# Patient Record
Sex: Female | Born: 1950 | Race: Black or African American | Hispanic: No | Smoking: Never smoker
Health system: Southern US, Community
[De-identification: ages and names within clinical notes are randomized; demographics above are authoritative.]

## PROBLEM LIST (undated history)

## (undated) DIAGNOSIS — M545 Low back pain, unspecified: Secondary | ICD-10-CM

## (undated) DIAGNOSIS — E785 Hyperlipidemia, unspecified: Secondary | ICD-10-CM

## (undated) DIAGNOSIS — F329 Major depressive disorder, single episode, unspecified: Secondary | ICD-10-CM

## (undated) DIAGNOSIS — IMO0001 Reserved for inherently not codable concepts without codable children: Secondary | ICD-10-CM

## (undated) DIAGNOSIS — J329 Chronic sinusitis, unspecified: Secondary | ICD-10-CM

## (undated) DIAGNOSIS — F32A Depression, unspecified: Secondary | ICD-10-CM

## (undated) DIAGNOSIS — Z8719 Personal history of other diseases of the digestive system: Secondary | ICD-10-CM

## (undated) DIAGNOSIS — R000 Tachycardia, unspecified: Secondary | ICD-10-CM

## (undated) DIAGNOSIS — I700 Atherosclerosis of aorta: Secondary | ICD-10-CM

## (undated) DIAGNOSIS — I10 Essential (primary) hypertension: Secondary | ICD-10-CM

## (undated) HISTORY — DX: Reserved for inherently not codable concepts without codable children: IMO0001

## (undated) HISTORY — DX: Low back pain, unspecified: M54.50

## (undated) HISTORY — PX: BACK SURGERY: SHX140

## (undated) HISTORY — DX: Hyperlipidemia, unspecified: E78.5

## (undated) HISTORY — DX: Atherosclerosis of aorta: I70.0

## (undated) HISTORY — DX: Major depressive disorder, single episode, unspecified: F32.9

## (undated) HISTORY — PX: TUBAL LIGATION: SHX77

## (undated) HISTORY — DX: Personal history of other diseases of the digestive system: Z87.19

## (undated) HISTORY — PX: ABDOMINAL HYSTERECTOMY: SHX81

## (undated) HISTORY — DX: Depression, unspecified: F32.A

## (undated) HISTORY — DX: Essential (primary) hypertension: I10

## (undated) HISTORY — DX: Low back pain: M54.5

## (undated) HISTORY — DX: Tachycardia, unspecified: R00.0

---

## 1999-11-14 ENCOUNTER — Ambulatory Visit (HOSPITAL_COMMUNITY): Admission: RE | Admit: 1999-11-14 | Discharge: 1999-11-14 | Payer: Self-pay | Admitting: Specialist

## 1999-11-14 ENCOUNTER — Encounter: Payer: Self-pay | Admitting: Specialist

## 1999-11-28 ENCOUNTER — Encounter: Payer: Self-pay | Admitting: Specialist

## 1999-11-28 ENCOUNTER — Ambulatory Visit (HOSPITAL_COMMUNITY): Admission: RE | Admit: 1999-11-28 | Discharge: 1999-11-28 | Payer: Self-pay | Admitting: Specialist

## 1999-12-12 ENCOUNTER — Encounter: Payer: Self-pay | Admitting: Specialist

## 1999-12-12 ENCOUNTER — Ambulatory Visit (HOSPITAL_COMMUNITY): Admission: RE | Admit: 1999-12-12 | Discharge: 1999-12-12 | Payer: Self-pay | Admitting: Specialist

## 2000-01-11 ENCOUNTER — Inpatient Hospital Stay (HOSPITAL_COMMUNITY): Admission: RE | Admit: 2000-01-11 | Discharge: 2000-01-13 | Payer: Self-pay | Admitting: Specialist

## 2000-01-11 ENCOUNTER — Encounter (INDEPENDENT_AMBULATORY_CARE_PROVIDER_SITE_OTHER): Payer: Self-pay | Admitting: Specialist

## 2000-01-11 ENCOUNTER — Encounter: Payer: Self-pay | Admitting: Specialist

## 2000-01-22 ENCOUNTER — Inpatient Hospital Stay (HOSPITAL_COMMUNITY): Admission: EM | Admit: 2000-01-22 | Discharge: 2000-01-25 | Payer: Self-pay | Admitting: Emergency Medicine

## 2000-01-23 ENCOUNTER — Encounter: Payer: Self-pay | Admitting: Orthopedic Surgery

## 2001-07-11 ENCOUNTER — Emergency Department (HOSPITAL_COMMUNITY): Admission: EM | Admit: 2001-07-11 | Discharge: 2001-07-11 | Payer: Self-pay | Admitting: Emergency Medicine

## 2001-12-10 ENCOUNTER — Encounter (HOSPITAL_COMMUNITY): Admission: RE | Admit: 2001-12-10 | Discharge: 2002-03-10 | Payer: Self-pay | Admitting: Dentistry

## 2001-12-10 ENCOUNTER — Encounter (HOSPITAL_COMMUNITY): Payer: Self-pay | Admitting: Dentistry

## 2002-01-01 ENCOUNTER — Other Ambulatory Visit: Admission: RE | Admit: 2002-01-01 | Discharge: 2002-01-01 | Payer: Self-pay | Admitting: Internal Medicine

## 2002-12-17 ENCOUNTER — Encounter: Payer: Self-pay | Admitting: Internal Medicine

## 2002-12-17 ENCOUNTER — Ambulatory Visit (HOSPITAL_COMMUNITY): Admission: RE | Admit: 2002-12-17 | Discharge: 2002-12-17 | Payer: Self-pay | Admitting: Internal Medicine

## 2004-06-13 ENCOUNTER — Emergency Department (HOSPITAL_COMMUNITY): Admission: EM | Admit: 2004-06-13 | Discharge: 2004-06-13 | Payer: Self-pay | Admitting: Emergency Medicine

## 2006-04-13 ENCOUNTER — Emergency Department (HOSPITAL_COMMUNITY): Admission: EM | Admit: 2006-04-13 | Discharge: 2006-04-13 | Payer: Self-pay | Admitting: Emergency Medicine

## 2008-01-14 ENCOUNTER — Encounter: Payer: Self-pay | Admitting: Pulmonary Disease

## 2008-01-22 ENCOUNTER — Ambulatory Visit: Payer: Self-pay | Admitting: Cardiology

## 2008-01-22 ENCOUNTER — Encounter (INDEPENDENT_AMBULATORY_CARE_PROVIDER_SITE_OTHER): Payer: Self-pay | Admitting: Internal Medicine

## 2008-01-22 ENCOUNTER — Inpatient Hospital Stay (HOSPITAL_COMMUNITY): Admission: RE | Admit: 2008-01-22 | Discharge: 2008-01-24 | Payer: Self-pay | Admitting: Internal Medicine

## 2008-01-22 ENCOUNTER — Ambulatory Visit: Payer: Self-pay | Admitting: Vascular Surgery

## 2008-01-22 ENCOUNTER — Encounter: Payer: Self-pay | Admitting: Emergency Medicine

## 2008-01-24 ENCOUNTER — Encounter (INDEPENDENT_AMBULATORY_CARE_PROVIDER_SITE_OTHER): Payer: Self-pay | Admitting: *Deleted

## 2008-08-01 ENCOUNTER — Emergency Department (HOSPITAL_COMMUNITY): Admission: EM | Admit: 2008-08-01 | Discharge: 2008-08-01 | Payer: Self-pay | Admitting: Emergency Medicine

## 2009-04-08 DIAGNOSIS — IMO0001 Reserved for inherently not codable concepts without codable children: Secondary | ICD-10-CM

## 2009-04-08 HISTORY — DX: Reserved for inherently not codable concepts without codable children: IMO0001

## 2009-04-21 ENCOUNTER — Ambulatory Visit: Payer: Self-pay | Admitting: Vascular Surgery

## 2010-01-05 ENCOUNTER — Inpatient Hospital Stay (HOSPITAL_COMMUNITY): Admission: EM | Admit: 2010-01-05 | Discharge: 2010-01-07 | Payer: Self-pay | Admitting: Emergency Medicine

## 2010-02-16 LAB — LIPID PANEL
Cholesterol: 263 mg/dL — AB (ref 0–200)
HDL: 57 mg/dL (ref 35–70)
LDL Cholesterol: 134 mg/dL
Triglycerides: 360 mg/dL — AB (ref 40–160)

## 2010-05-26 ENCOUNTER — Ambulatory Visit: Payer: Self-pay | Admitting: Cardiology

## 2010-05-26 LAB — BASIC METABOLIC PANEL: BUN: 22 mg/dL — AB (ref 4–21)

## 2010-08-15 ENCOUNTER — Emergency Department (HOSPITAL_COMMUNITY): Admission: EM | Admit: 2010-08-15 | Discharge: 2010-08-16 | Payer: Self-pay | Admitting: Emergency Medicine

## 2010-08-31 ENCOUNTER — Ambulatory Visit: Payer: Self-pay | Admitting: Cardiology

## 2010-11-12 ENCOUNTER — Encounter: Payer: Self-pay | Admitting: Cardiology

## 2010-11-12 DIAGNOSIS — E785 Hyperlipidemia, unspecified: Secondary | ICD-10-CM | POA: Insufficient documentation

## 2010-11-12 DIAGNOSIS — I10 Essential (primary) hypertension: Secondary | ICD-10-CM | POA: Insufficient documentation

## 2010-11-12 DIAGNOSIS — M545 Low back pain, unspecified: Secondary | ICD-10-CM | POA: Insufficient documentation

## 2010-11-12 DIAGNOSIS — IMO0001 Reserved for inherently not codable concepts without codable children: Secondary | ICD-10-CM | POA: Insufficient documentation

## 2010-11-12 DIAGNOSIS — R000 Tachycardia, unspecified: Secondary | ICD-10-CM | POA: Insufficient documentation

## 2010-12-21 LAB — URINALYSIS, ROUTINE W REFLEX MICROSCOPIC
Bilirubin Urine: NEGATIVE
Ketones, ur: NEGATIVE mg/dL
Nitrite: NEGATIVE
Protein, ur: NEGATIVE mg/dL
Urobilinogen, UA: 0.2 mg/dL (ref 0.0–1.0)

## 2010-12-21 LAB — CBC
MCH: 32.3 pg (ref 26.0–34.0)
MCHC: 34.4 g/dL (ref 30.0–36.0)
MCV: 93.9 fL (ref 78.0–100.0)
Platelets: 222 10*3/uL (ref 150–400)
RBC: 4.09 MIL/uL (ref 3.87–5.11)
RDW: 14.2 % (ref 11.5–15.5)

## 2010-12-21 LAB — DIFFERENTIAL
Basophils Absolute: 0 10*3/uL (ref 0.0–0.1)
Lymphocytes Relative: 40 % (ref 12–46)
Monocytes Absolute: 0.8 10*3/uL (ref 0.1–1.0)
Neutro Abs: 4.7 10*3/uL (ref 1.7–7.7)

## 2010-12-21 LAB — COMPREHENSIVE METABOLIC PANEL
Albumin: 3.7 g/dL (ref 3.5–5.2)
BUN: 17 mg/dL (ref 6–23)
Chloride: 105 mEq/L (ref 96–112)
Creatinine, Ser: 0.9 mg/dL (ref 0.4–1.2)
Total Bilirubin: 1 mg/dL (ref 0.3–1.2)

## 2010-12-21 LAB — LIPASE, BLOOD: Lipase: 23 U/L (ref 11–59)

## 2010-12-28 LAB — CBC
Hemoglobin: 9 g/dL — ABNORMAL LOW (ref 12.0–15.0)
MCHC: 33.3 g/dL (ref 30.0–36.0)
RBC: 2.89 MIL/uL — ABNORMAL LOW (ref 3.87–5.11)

## 2010-12-30 ENCOUNTER — Ambulatory Visit (INDEPENDENT_AMBULATORY_CARE_PROVIDER_SITE_OTHER): Payer: Medicare Other | Admitting: Cardiology

## 2010-12-30 ENCOUNTER — Encounter: Payer: Self-pay | Admitting: Cardiology

## 2010-12-30 VITALS — BP 130/86 | HR 88 | Wt 134.0 lb

## 2010-12-30 DIAGNOSIS — I10 Essential (primary) hypertension: Secondary | ICD-10-CM

## 2010-12-30 DIAGNOSIS — J40 Bronchitis, not specified as acute or chronic: Secondary | ICD-10-CM

## 2010-12-30 DIAGNOSIS — Z79899 Other long term (current) drug therapy: Secondary | ICD-10-CM

## 2010-12-30 DIAGNOSIS — I119 Hypertensive heart disease without heart failure: Secondary | ICD-10-CM

## 2010-12-30 DIAGNOSIS — J209 Acute bronchitis, unspecified: Secondary | ICD-10-CM

## 2010-12-30 DIAGNOSIS — J45991 Cough variant asthma: Secondary | ICD-10-CM | POA: Insufficient documentation

## 2010-12-30 MED ORDER — HYDROCOD POLST-CHLORPHEN POLST 10-8 MG/5ML PO LQCR: 5.0000 mL | Freq: Two times a day (BID) | ORAL | Status: DC | PRN

## 2010-12-30 MED ORDER — AZITHROMYCIN 250 MG PO TABS: ORAL_TABLET | ORAL | Status: DC

## 2010-12-30 NOTE — Assessment & Plan Note (Signed)
Her cough is nonproductive.  It is a deep cough but her auscultation of the lungs is unremarkable.  We will prescribe Z-Pak and Tussionex and she will continue Mucinex

## 2010-12-30 NOTE — Progress Notes (Signed)
History of Present Illness: 2 week hx of severe nonproductive cough.  No sputum. Chronic orthopnea since back trouble.Patient has a long history of essential hypertension and cardiac arrhythmias.  She has had multiple back operations her chronic low back pain.  She has not been expressing any chest pain to suggest angina pectoris.  Current Outpatient Prescriptions  Medication Sig Dispense Refill  . carvedilol (COREG) 3.125 MG tablet Take 3.125 mg by mouth 2 (two) times daily.        . cyclobenzaprine (FLEXERIL) 10 MG tablet Take 10 mg by mouth 3 (three) times daily.        . DiphenhydrAMINE HCl (BENADRYL PO) Take by mouth as needed.        Marland Kitchen HYDROcodone-acetaminophen (NORCO) 10-325 MG per tablet Take 1 tablet by mouth as needed.        Marland Kitchen omeprazole (PRILOSEC OTC) 20 MG tablet Take 20 mg by mouth as needed.        . Telmisartan-Amlodipine (TWYNSTA) 80-10 MG TABS Take by mouth daily.        Marland Kitchen zolpidem (AMBIEN) 5 MG tablet Take 5 mg by mouth as needed. 1-2 TABLETS PRN         No Known Allergies  Patient Active Problem List  Diagnoses  . Lower back pain  . HTN (hypertension)  . Dyslipidemia  . Mild aortic sclerosis  . Tachycardia    History  Smoking status  . Never Smoker   Smokeless tobacco  . Not on file    History  Alcohol Use: Not on file    Family History  Problem Relation Age of Onset  . Stroke Father   . Heart disease Mother     CABG  . Heart attack Mother     Review of Systems: Constitutional: no fever chills diaphoresis or fatigue or change in weight.  Head and neck: no hearing loss, no epistaxis, no photophobia or visual disturbance. Respiratory: No cough, shortness of breath or wheezing. Cardiovascular: No chest pain peripheral edema, palpitations. Gastrointestinal: No abdominal distention, no abdominal pain, no change in bowel habits hematochezia or melena. Genitourinary: No dysuria, no frequency, no urgency, no nocturia. Musculoskeletal:No arthralgias, no  back pain, no gait disturbance or myalgias. Neurological: No dizziness, no headaches, no numbness, no seizures, no syncope, no weakness, no tremors. Hematologic: No lymphadenopathy, no easy bruising. Psychiatric: No confusion, no hallucinations, no sleep disturbance.    Physical Exam: Vital signs as recorded.  This is a well-developed well-nourished woman in no distress.  Skin is clear.Pupils equal and reactive.   Extraocular Movements are full.  There is no scleral icterus.  The mouth and pharynx are normal.  The neck is supple.  The carotids reveal no bruits.  The jugular venous pressure is normal.  The thyroid is not enlarged.  There is no lymphadenopathy.The chest is clear to percussion and auscultation. There are no rales or rhonchi. Expansion of the chest is symmetrical.The precordium is quiet.  The first heart sound is normal.  The second heart sound is physiologically split.  There is no murmur gallop rub or click.  There is no abnormal lift or heave.The abdomen is soft and nontender. Bowel sounds are normal. The liver and spleen are not enlarged. There Are no abdominal masses. There are no bruits.The pedal pulses are good.  There is no phlebitis or edema.  There is no cyanosis or clubbing.Strength is normal and symmetrical in all extremities.  There is no lateralizing weakness.  There are no sensory deficits.  Assessment / Plan:

## 2010-12-30 NOTE — Assessment & Plan Note (Signed)
Her diastolic blood pressure is still slightly high related in part to the stress of her acute sinusitis and bronchitis.  She will continue on her same pressure meds.

## 2011-01-02 LAB — HEPATIC FUNCTION PANEL
Bilirubin, Direct: 0.1 mg/dL (ref 0.0–0.3)
Total Bilirubin: 0.6 mg/dL (ref 0.3–1.2)

## 2011-01-02 LAB — POCT I-STAT, CHEM 8
Chloride: 110 mEq/L (ref 96–112)
HCT: 36 % (ref 36.0–46.0)
Potassium: 4.2 mEq/L (ref 3.5–5.1)

## 2011-01-02 LAB — DIFFERENTIAL
Basophils Relative: 1 % (ref 0–1)
Eosinophils Absolute: 0.1 10*3/uL (ref 0.0–0.7)
Eosinophils Absolute: 0.2 10*3/uL (ref 0.0–0.7)
Lymphocytes Relative: 40 % (ref 12–46)
Lymphs Abs: 3.4 10*3/uL (ref 0.7–4.0)
Monocytes Absolute: 0.8 10*3/uL (ref 0.1–1.0)
Monocytes Relative: 10 % (ref 3–12)
Monocytes Relative: 8 % (ref 3–12)
Neutro Abs: 4.2 10*3/uL (ref 1.7–7.7)
Neutrophils Relative %: 49 % (ref 43–77)

## 2011-01-02 LAB — TYPE AND SCREEN: ABO/RH(D): O NEG

## 2011-01-02 LAB — LIPASE, BLOOD: Lipase: 22 U/L (ref 11–59)

## 2011-01-02 LAB — FERRITIN: Ferritin: 46 ng/mL (ref 10–291)

## 2011-01-02 LAB — CBC
HCT: 26 % — ABNORMAL LOW (ref 36.0–46.0)
HCT: 34.8 % — ABNORMAL LOW (ref 36.0–46.0)
Hemoglobin: 11.5 g/dL — ABNORMAL LOW (ref 12.0–15.0)
Hemoglobin: 8.4 g/dL — ABNORMAL LOW (ref 12.0–15.0)
Hemoglobin: 8.6 g/dL — ABNORMAL LOW (ref 12.0–15.0)
Hemoglobin: 9.2 g/dL — ABNORMAL LOW (ref 12.0–15.0)
MCHC: 32.8 g/dL (ref 30.0–36.0)
MCHC: 32.9 g/dL (ref 30.0–36.0)
MCV: 93.6 fL (ref 78.0–100.0)
Platelets: 200 10*3/uL (ref 150–400)
RBC: 2.7 MIL/uL — ABNORMAL LOW (ref 3.87–5.11)
RBC: 3.72 MIL/uL — ABNORMAL LOW (ref 3.87–5.11)
RDW: 15.2 % (ref 11.5–15.5)
RDW: 15.9 % — ABNORMAL HIGH (ref 11.5–15.5)
WBC: 6.5 10*3/uL (ref 4.0–10.5)
WBC: 7.1 10*3/uL (ref 4.0–10.5)
WBC: 8 10*3/uL (ref 4.0–10.5)

## 2011-01-02 LAB — URINALYSIS, ROUTINE W REFLEX MICROSCOPIC
Bilirubin Urine: NEGATIVE
Ketones, ur: 15 mg/dL — AB
Nitrite: NEGATIVE
Urobilinogen, UA: 0.2 mg/dL (ref 0.0–1.0)

## 2011-01-02 LAB — IRON AND TIBC
Saturation Ratios: 14 % — ABNORMAL LOW (ref 20–55)
TIBC: 262 ug/dL (ref 250–470)
UIBC: 225 ug/dL

## 2011-01-02 LAB — COMPREHENSIVE METABOLIC PANEL
ALT: 14 U/L (ref 0–35)
Calcium: 8.4 mg/dL (ref 8.4–10.5)
Glucose, Bld: 96 mg/dL (ref 70–99)
Sodium: 140 mEq/L (ref 135–145)
Total Protein: 6.4 g/dL (ref 6.0–8.3)

## 2011-01-02 LAB — H. PYLORI ANTIBODY, IGG: H Pylori IgG: <0.4 {ISR}

## 2011-01-02 LAB — PREPARE RBC (CROSSMATCH)

## 2011-01-02 LAB — GASTRIC OCCULT BLOOD (1-CARD TO LAB)
Occult Blood, Gastric: POSITIVE — AB
pH, Gastric: 5

## 2011-01-02 LAB — ABO/RH: ABO/RH(D): O NEG

## 2011-01-02 LAB — HEMOCCULT GUIAC POC 1CARD (OFFICE): Fecal Occult Bld: NEGATIVE

## 2011-02-21 NOTE — Procedures (Signed)
RENAL ARTERY DUPLEX EVALUATION   INDICATION:  Uncontrolled hypertension.   HISTORY:  Diabetes:  No.  Cardiac:  No.  Hypertension:  Yes.  Smoking:  No.   RENAL ARTERY DUPLEX FINDINGS:  Aorta-Proximal:  78 cm/s  Aorta-Mid:  95 cm/s  Aorta-Distal:  65 cm/s  Celiac Artery Origin:  86 cm/s  SMA Origin:  101 cm/s                                    RIGHT               LEFT  Renal Artery Origin:             75 cm/s             82 cm/s  Renal Artery Proximal:           68 cm/s             104 cm/s  Renal Artery Mid:                96 cm/s             78 cm/s  Renal Artery Distal:             75 cm/s             35 cm/s  Hilar Acceleration Time (AT):  Renal-Aortic Ratio (RAR):        1.0                 1.1  Kidney Size:                     9.5 cm              9.0 cm  End Diastolic Ratio (EDR):  Resistive Index (RI):            0.67                0.65   IMPRESSION:  1. No evidence of stenosis noted in the bilateral renal arteries.  2. The bilateral kidney length measurements and resistive indices are      within normal limits.  3. A preliminary report was faxed to Dr. Silva Bandy office on      04/21/2009.      ___________________________________________  Larina Earthly, M.D.   CH/MEDQ  D:  04/21/2009  T:  04/21/2009  Job:  [id number]

## 2011-02-21 NOTE — Procedures (Signed)
EEG NUMBER:  This patient is a 60 year old female, right handed currently on the  medication Zofran, Tylenol, Protonix, aspirin, oxycodone, clonidine,  Dilaudid, Avelox, Deltasone Ventolin and Mucinex. The patient was  admitted for TIA, hypertension, sinus tachycardia and a facial tremor  when falling at home.  She presented with acute changes of speech and  memory and had been treated for bronchitis for the last two weeks with  antibiotics and inhalers   A posterior dominant background rhythm at 9 Hz is seen symmetrically  emitting from both posterior hemispheres and promptly attenuating with  eye opening.  There is large amplitude EEG activity noted and theta  range frequencies of drowsiness dominates the central regions  bilaterally. Photic stimulation did not lead to photic entrainment.  Hyperventilation had to be deferred due to the patient's pulmonary  history.  Focal slowing was not seen. There is lots of tremor artifact  noticed over the frontal polar and frontal regions.  Eye movement  artifact is also noted.  I did not see epileptiform discharges.   CONCLUSION:  Normal EEG for the patient's age and conscious state with  regular appearing EKG rhythm strip.      Melvyn Novas, M.D.  Electronically Signed     NF:AOZH  D:  01/22/2008 22:53:16  T:  01/22/2008 23:43:02  Job #:  [id number]

## 2011-02-21 NOTE — Consult Note (Signed)
Teresa Benson, Teresa Benson                ACCOUNT NO.:  [id number]   MEDICAL RECORD NO.:  [id number]          PATIENT TYPE:  INP   LOCATION:  3029                         FACILITY:  [facility]   PHYSICIAN:  Pramod P. Pearlean Brownie, MD    DATE OF BIRTH:  1951/06/21   DATE OF CONSULTATION:  DATE OF DISCHARGE:  01/22/2008                                 CONSULTATION   REASON FOR REFERRAL:  Speech difficulties.   HISTORY OF PRESENT ILLNESS:  Ms. Troup is a 60 year old African  American lady who was admitted with a 3-day history of intermittent  substituting speech difficulties, falls, left leg numbness, and some  confusion.  She was recently diagnosed with recurrent bronchitis for the  last few months and has been off and on antibiotics.  Last Tuesday, she  was seen in an Urgent Care and started on Avelox and given some  inhalers.  At that time, she was found to have some confusion and  disorientation.  Subsequently started having some speech difficulties,  which she describes as being unable to produce sound or get the words  out, she knew what she wanted to speak.  Her speech deteriorated during  the day and the daughter could barely understand her and forced her to  come to the emergency room.  Since admission, she continued to have some  mild speech difficulties.  She underwent an MRI scan of the brain, which  was normal as well as MRA.  This morning, she feels her speech is  completely back to normal.  She denies any confusion or gait  difficulties.  She did have some transient left foot numbness on  Tuesday, which also seems to have resolved.   PAST MEDICAL HISTORY:  Significant for hypertension and chronic back  pain.   HOME MEDICATIONS:  1. Avelox.  2. Delsym.   MEDICATION ALLERGIES:  None.   PAST SURGICAL HISTORY:  None.   FAMILY HISTORY:  Noncontributory.   SOCIAL HISTORY:  The patient lives in [place].  She does not smoke.   PHYSICAL EXAMINATION:  GENERAL:  Reveals a pleasant  middle-aged African  American lady who is not in distress.  VITAL SIGNS:  She is afebrile.  Temperature 98.2, blood pressure is  176/100, respiratory rate 16 per minute, __________ .  HEAD:  Nontraumatic.  NECK:  Supple.  There is no bruit.  ENT EXAM:  Unremarkable.  CARDIAC EXAM:  No murmur, gallop.  LUNGS:  Clear to auscultation.  ABDOMEN:  Soft, nontender.  NEUROLOGIC EXAM:  She is pleasant, awake, alert, and cooperative.  There  is no aphasia, apraxia, or dysarthria.  Her speech is quite fluent.  She  has good comprehension, naming, and repetitions.  Face is symmetric.  There is no weakness.  Tongue is midline.  Motor system exam, lower and  upper extremity with symmetric strength, tone, reflexes, coordination.  Her gait is not tested.  Sensation is intact.   DATA REVIEW:  MRI scan of brain done yesterday is completely normal.  There are no acute infarcts.  MRI of the brain shows no large vessels  stenosis  or aneurysms.  Comprehensive metabolic panel labs, CBC, UA, and  magnesium are all normal.  Chest x-ray is unremarkable except for  possible small hiatal hernia.   IMPRESSION:  A 60 year old lady with intermittent symptoms of transient  speech difficulties, leg numbness, falls, and some confusion of unclear  etiology, this has occurred in the setting of bronchitis illness.  I  doubt this represents the stroke or transient ischemic attack, perhaps  be related to her underlying anxiety as she has gone through a lot of  stress and bereavement in the family.   PLAN:  I would pursue further evaluation, while checking the EEG to  evaluate procedure, otherwise 2D echocardiogram.  Start a baby aspirin  once a day given her family history of strokes.  I will be happy to  follow her on consult, given fall precautions.           ______________________________  Sunny Schlein. Pearlean Brownie, MD     PPS/MEDQ  D:  01/23/2008  T:  01/24/2008  Job:  [id number]

## 2011-02-21 NOTE — Discharge Summary (Signed)
[name], Benson                ACCOUNT NO.:  [id number]   MEDICAL RECORD NO.:  [id number]          PATIENT TYPE:  INP   LOCATION:  3029                         FACILITY:  [facility]   PHYSICIAN:  Mobolaji B. Bakare, M.D.DATE OF BIRTH:  1951/02/14   DATE OF ADMISSION:  01/22/2008  DATE OF DISCHARGE:  01/24/2008                               DISCHARGE SUMMARY   PRIMARY CARE PHYSICIAN:  Quarry manager, [address].   FINAL DIAGNOSES:  1. Multiple neurological symptoms ? anxiety related versus Avelox      related encephalopathy.  All symptoms are now resolved.  Negative      workup for stroke/transient ischemic attack.  2. Sinus tachycardia.   PROCEDURE:  1. Head CT scan done on January 22, 2008 showed no acute intracranial      abnormality.  2. Chest x-ray showed no acute cardiopulmonary abnormality.  Small      hiatal hernia and mildly dilated thoracic esophagus.  3. MRI of the head showed no acute abnormality.  4. MRA head, normal intracranial large and medium-sized vessels.  5. CT angiogram of the chest negative for acute pulmonary embolism,      mild dependent atelectasis, and swelling of borderline enlarged      right paratracheal lymph node which is nonspecific.  6. Carotid Doppler showed no internal carotid artery stenosis.      Vertebral artery flow is antegrade, mild mixed plaque origin of      internal carotid artery.  7. 2D echocardiogram.  Result is pending.   BRIEF HISTORY:  Teresa Benson is a pleasant 60 year old African-American  female who has been having trouble with recurrent bronchitis in the last  3 months, essentially having cough.  She goes to Urgent Care, the last  visit was about a week ago.  She stated that she received steroid  injection and was also placed on Avelox.  She was given a referral to  follow up with pulmonologist (Dr. Vassie Loll).  After she received a steroid  shot family reported some agitation and the patient subsequently  developed some slurred  speech, dysphagia, and numbness in her left leg.  She was brought to the emergency room on January 22, 2008.  Initial vitals  showed temperature of 98.2, blood pressure 176/106, heart rate of 116,  02 sats of 98%-99% on room air.  Physical examination showed no focal  neurological deficits.  Speech was clear but hesitant.  She had no  aphasia.  Initial laboratory data was significant for mild leukocytosis  of 11,000 and potassium of 3.4.  The patient was admitted for stroke  workup and uncontrolled hypertension.  She was also noted to have some  blepharism.   HOSPITAL COURSE:  1. Multiple neurological symptoms.  The patient had complained of      slurred speech, speech hesitancy, unable to get words out as      quickly as she would want to, numbness in her left lower extremity      which was transient, and facial blepharism.  Physical examination      was otherwise nonfocal.  The patient had  MRI of the brain which was      negative for infarct.  No intracranial mass.  She was seen in      consultation by Dr. Buzzy Han from neurology.  She was felt to have      transient multifocal neurological symptoms in the setting of recent      acute bronchitis, etiology is unclear, probably underlying anxiety      or Avelox related encephalopathy.  The patient was started on small      dose aspirin, but TIA was doubtful.  She had carotid Doppler which      was normal.  On further questioning, family stated that the      patient's speech gets in this way with some slowness and facial      spasms when she gets anxious.  All these symptoms resolved within      24 hours of hospitalization.  She will be completing a 2D      echocardiogram today.  She will follow up result with her primary      care physician at [facility].  The patient had a negative ANA and      sed rate was normal.  However, C-reactive protein was slightly      elevated at 2.2.  Avelox has been discontinued.   1. Sinus tachycardia.  She  was noted on admission to have a sinus      tachycardia of 116.  Cardiac panel was negative.  The patient had a      normal TSH of 0.873.  A D-dimer was slightly elevated 0.92,      although she had no acute shortness of breath at this point.  We      proceeded to ruling out pulmonary embolism given recent history of      respiratory symptoms.  The patient had a negative CT angiogram for      pulmonary embolism.  There was no pneumonia.   1. Gastroesophageal reflux disease.  The patient has these symptoms of      heartburn which she stated that has been ongoing since a few months      ago.  She was started on Protonix.  It was also noted on chest x-      ray that she has a small hiatal hernia and mildly dilated thoracic      esophagus.  The patient will continue with PPI and if heartburn      symptoms are not improving, referral for upper endoscopy by a      gastroenterologist is recommended.   1. Recurrent bronchitic symptoms.  The patient was seen by [facility]      physician and she has had about 3 episodes of bronchitis with cough      in the last 3 months.  She has been given a referral to follow up      with Dr. Vassie Loll.   1. Hypertension.  The patient has a diagnosis of history of      hypertension.  She was admitted with a blood pressure of 176/106.      The patient did not receive any antihypertensive during this      hospitalization.  Blood pressure has since normalized ranging      between 110-120 systolic and 60-70 diastolic.  There is no      indication to start antihypertensive at this time.  Further      monitoring per patient's primary care physician.   DISCHARGE MEDICATIONS:  1. Baby aspirin 1 tablet daily.  2. Protonix 40 mg daily.   RECOMMENDATIONS:  1. Follow up 2D echocardiogram with primary care physician early next      week.  2. Referral for upper GI evaluation regarding heartburn symptoms if      symptoms are not improving with proton pump inhibitor.  Also  noted      is hiatal hernia on chest x-ray and mildly dilated distal      esophagus.  3. Follow up with Dr. Vassie Loll as previously scheduled.      Mobolaji B. Corky Downs, M.D.  Electronically Signed     MBB/MEDQ  D:  01/24/2008  T:  01/24/2008  Job:  [id number]   cc:   Oretha Milch, MD  [facility]

## 2011-02-21 NOTE — H&P (Signed)
Teresa Benson, Teresa Benson                ACCOUNT NO.:  [id number]   MEDICAL RECORD NO.:  [id number]          PATIENT TYPE:  EMS   LOCATION:  ED                           FACILITY:  [facility]   PHYSICIAN:  Della Goo, M.D. DATE OF BIRTH:  Nov 29, 1950   DATE OF ADMISSION:  01/22/2008  DATE OF DISCHARGE:                              HISTORY & PHYSICAL   PRIMARY CARE PHYSICIAN:  Unassigned.   CHIEF COMPLAINT:  Difficulty speaking.   HISTORY OF PRESENT ILLNESS:  This is a 60 year old female presenting to  the emergency department at [facility] with complaints of  difficulty speaking, anomia and hesitancy in speech for 4 days.  Also,  reports having difficulty with her memory and having facial tremors when  she attempts to speak.   The patient reports being seen at the [facility] for  treatment of a bronchitis.  She was placed on Avelox therapy, Delsym  cough syrup and medication, antibiotic therapy 2 days ago.  Her other  medications included an albuterol inhaler.  She reports being  administered a steroid injection as well.   She also reports falling 2 days ago and suffering another fall one day  ago while she was in the tub.   PAST MEDICAL HISTORY:  1. Hypertension.  2. Chronic back pain.  3. Recent acute bronchitis.   PAST SURGICAL HISTORY:  1. History of a total abdominal hysterectomy.  2. Three back surgeries in the past.  3. Foot surgeries/bunionectomies   MEDICATIONS:  Insulin, Avelox and Delsym therapy.   ALLERGIES:  NO KNOWN DRUG ALLERGIES.   SOCIAL HISTORY:  The patient is a nonsmoker, nondrinker.   FAMILY HISTORY:  Positive for hypertension in her father.  Positive  coronary artery disease in her mother.  No history of cancer in her  family that she knows of, and no history of diabetes mellitus in her  family that she knows of.   REVIEW OF SYSTEMS:  Pertinent for mentioned above.   PHYSICAL EXAMINATION FINDINGS:  GENERAL:  This is a  60 year old  overweight female in discomfort but no acute distress.  VITAL SIGNS:  Temperature 98.2, blood pressure 176/106, heart rate 116,  respirations 19, O2 sats 98-99%.  HEENT:  Examination normocephalic, atraumatic.  Pupils equally round  reactive to light.  Extraocular muscles are intact.  Funduscopic benign.  Oropharynx is clear.  NECK:  Supple full range of motion.  No thyromegaly, adenopathy, jugular  venous distention.  CARDIOVASCULAR:  Tachycardiac rate and rhythm.  No murmurs, gallops or  rubs.  LUNGS:  Clear to auscultation bilaterally.  ABDOMEN:  Positive bowel sounds, soft, nontender, nondistended.  EXTREMITIES:  Without cyanosis, clubbing or edema.  NEUROLOGIC:  Alert and oriented x3.  Speech is clear but hesitant.  The  patient appears anxious.  Her cranial nerves are intact and there are no  focal deficits on examination.   LABORATORY STUDIES:  White blood cell count 11.3, hemoglobin 12.1,  hematocrit 35.8, platelets 265, neutrophils 60%, lymphocytes 28%, MCV  89.0, sodium 136, potassium 3.4, chloride 108, carbon dioxide 22, BUN  18,  creatinine 0.78 and glucose 108, albumin 3.3, AST 17, ALT 13,  magnesium level 2.0.  Chest x-ray, no acute disease process, small  hiatal hernia noted and mildly dilated thoracic esophagus.  CT scan of  the head negative for any acute findings.   ASSESSMENT:  A 60 year old female being admitted with:  1. Expressive aphasia.  2. Weakness.  3. Uncontrolled hypertension.  4. Facial tremors.   PLAN:  The patient will be admitted to a telemetry area for  cardiopulmonary monitoring.  Cardiac enzymes will be performed.  A CVA  workup will be initiated with an MRI, MRA study ordered along with  carotid ultrasound studies and an EEG study.  Neurologic checks have  also been ordered to monitor the patient's symptoms.  And the p.r.n.  clonidine order has been ordered for her blood pressure.  DVT and GI  prophylaxis have been ordered and  aspirin therapy has also been started.      Della Goo, M.D.  Electronically Signed     HJ/MEDQ  D:  01/22/2008  T:  01/22/2008  Job:  [id number]

## 2011-02-24 NOTE — Discharge Summary (Signed)
[facility]  Patient:    Teresa Benson, Teresa Benson                       MRN: [id number] Adm. Date:  [id number] Disc. Date: [id number] Attending:  Loanne Drilling Dictator:   Alexzandrew L. Perkins, P.A.-C.                           Discharge Summary  ADMITTING DIAGNOSES: 1. Herniated nucleus pulposus on the right, L4-5. 2. Hypertension. 3. History of postoperative blood transfusion without sequelae.  DISCHARGE DIAGNOSES: 1. Herniated nucleus pulposus on the right, L4-5, status post microdiskectomy,    extra-foraminal at L4-5. 2. Hypertension. 3. History of postoperative blood transfusion without sequelae.  PROCEDURE:  Patient was taken to the operating room on January 11, 2000 and underwent a microdiskectomy, extra-foraminal at L4-5. Surgeon:  Dr. Javier Docker.  Assistant:  Dr. Kerrin Champagne.  CONSULTANTS:  None.  BRIEF HISTORY:  Patient is a 60 year old female who has been followed by Dr. Shelle Iron, found to have an L4 radiculopathy with disabling left lower extremity pain and weakness.  She has been refractory to conservative measures and also had a nontherapeutic selective nerve root block and also epidural steroid injections.  She is known to have a herniated disc at L4-5 and it is felt since she has failed to improve with conservative measures, she would benefit from undergoing surgical intervention and subsequently admitted to [facility].  LABORATORY AND X-RAY FINDINGS:  Hemoglobin 12.2 and hematocrit of 36 on admission.  Hemoglobin did drop down to a level of 9.7 and 29.9.  Last noted hemoglobin and hematocrit were 10.0 and 30.0.  Urinalysis on admission was negative; followup UA on January 12, 2000 negative.  Blood group type: O-negative.  Intraoperative lumbar films dated January 11, 2000 show needles and surgical instruments directed to localize the L4-5 disc space in the operating room.  HOSPITAL COURSE:  Patient was admitted to [facility], taken to OR and underwent the above stated procedure without complication.  Patient tolerated the procedure well and was later transferred to the recovery room and then to the orthopedic floor for continued postop care.  Vital signs were followed on the patient throughout the hospital course.  She was placed on p.o. and IV push pain medications for surgery.  She was transferred to a step-down bed for close monitoring secondary to an epidural bleed at the time of surgery.  Her hemoglobin and hematocrits were followed very closely, which remained stable, as above.  On postop day #1, she was noted to have decreased pain and she was doing well; therefore, she was transferred to the orthopedic floor.  By postop day #2, she was doing much better.  The hemoglobin had remained stable.  She was tolerating p.o. analgesics and it was decided the patient would be discharged home.  DISCHARGE PLAN:  Patient is discharged home on January 13, 2000.  DISCHARGE DIAGNOSES: 1. Herniated nucleus pulposus on the right, L4-5, status post microdiskectomy,    extra-foraminal at L4-5. 2. Hypertension. 3. History of postoperative blood transfusion without sequelae.  DISCHARGE MEDICATIONS: 1. Vitamin C over the counter. 2. Percocet p.r.n. pain. 3. Robaxin p.r.n. spasm.  DIET:  As tolerated.  ACTIVITY:  Up as tolerated.  Back precautions at all times.  FOLLOWUP:  Follow up in 10 days to 2 weeks.  DISPOSITION:  Home.  CONDITION UPON DISCHARGE:  Improved.  DD:  02/15/00 TD:  02/20/00 Job: [id number]

## 2011-04-19 ENCOUNTER — Encounter: Payer: Self-pay | Admitting: Cardiology

## 2011-04-19 ENCOUNTER — Emergency Department (HOSPITAL_COMMUNITY)
Admission: EM | Admit: 2011-04-19 | Discharge: 2011-04-20 | Disposition: A | Discharge status: Home / Self Care | Payer: Medicare Other | Attending: Emergency Medicine | Admitting: Emergency Medicine

## 2011-04-19 DIAGNOSIS — M549 Dorsalgia, unspecified: Secondary | ICD-10-CM | POA: Insufficient documentation

## 2011-04-19 DIAGNOSIS — I10 Essential (primary) hypertension: Secondary | ICD-10-CM | POA: Insufficient documentation

## 2011-04-19 DIAGNOSIS — M545 Low back pain, unspecified: Secondary | ICD-10-CM | POA: Insufficient documentation

## 2011-04-20 ENCOUNTER — Emergency Department (HOSPITAL_COMMUNITY): Payer: Medicare Other

## 2011-04-20 LAB — URINALYSIS, ROUTINE W REFLEX MICROSCOPIC
Bilirubin Urine: NEGATIVE
Hgb urine dipstick: NEGATIVE
Nitrite: NEGATIVE
Specific Gravity, Urine: 1.007 (ref 1.005–1.030)
pH: 6.5 (ref 5.0–8.0)

## 2011-04-20 LAB — URINE MICROSCOPIC-ADD ON

## 2011-04-27 ENCOUNTER — Ambulatory Visit: Payer: Medicare Other | Admitting: Cardiology

## 2011-04-27 ENCOUNTER — Other Ambulatory Visit: Payer: Medicare Other | Admitting: *Deleted

## 2011-05-10 ENCOUNTER — Ambulatory Visit (INDEPENDENT_AMBULATORY_CARE_PROVIDER_SITE_OTHER): Payer: Medicare Other | Admitting: Cardiology

## 2011-05-10 ENCOUNTER — Encounter: Payer: Self-pay | Admitting: Cardiology

## 2011-05-10 ENCOUNTER — Other Ambulatory Visit (INDEPENDENT_AMBULATORY_CARE_PROVIDER_SITE_OTHER): Payer: Medicare Other | Admitting: *Deleted

## 2011-05-10 VITALS — BP 150/110 | HR 92 | Wt 137.0 lb

## 2011-05-10 DIAGNOSIS — M545 Low back pain, unspecified: Secondary | ICD-10-CM

## 2011-05-10 DIAGNOSIS — E785 Hyperlipidemia, unspecified: Secondary | ICD-10-CM

## 2011-05-10 DIAGNOSIS — I10 Essential (primary) hypertension: Secondary | ICD-10-CM

## 2011-05-10 DIAGNOSIS — I119 Hypertensive heart disease without heart failure: Secondary | ICD-10-CM

## 2011-05-10 DIAGNOSIS — E7800 Pure hypercholesterolemia, unspecified: Secondary | ICD-10-CM

## 2011-05-10 LAB — LIPID PANEL
HDL: 58.9 mg/dL (ref >39.00)
VLDL: 37.6 mg/dL (ref 0.0–40.0)

## 2011-05-10 LAB — BASIC METABOLIC PANEL
BUN: 14 mg/dL (ref 6–23)
GFR: 105.93 mL/min (ref >60.00)
Potassium: 5.1 mEq/L (ref 3.5–5.1)
Sodium: 141 mEq/L (ref 135–145)

## 2011-05-10 MED ORDER — LOSARTAN POTASSIUM 100 MG PO TABS: 100.0000 mg | ORAL_TABLET | Freq: Every day | ORAL | Status: DC

## 2011-05-10 NOTE — Assessment & Plan Note (Signed)
The patient has a history of dyslipidemia.  We are drawing fasting lab work on her today.  Previously after lab work we started her on Crestor and she took for 2 months and then stopped it and did not return for followup labs.  Presently she is not on any statin therapy.

## 2011-05-10 NOTE — Assessment & Plan Note (Signed)
The patient continues to have a lot of low back pain.  She has had four back operations already.  She sees a Midwife at [facility], Dr. Wyline Mood.She is presently on hydrocodone having weaned herself off of oxycodone.

## 2011-05-10 NOTE — Progress Notes (Signed)
Teresa Benson Date of Birth:  Mar 17, 1951 [facility] / [facility] [address]   [address] [place], [place]  [address] [phone]           Fax   [phone]  History of Present Illness: This pleasant 60 year old is seen for a scheduled followup office visit.  She has a history of essential hypertension and hypercholesterolemia.  She is not presently on any statin therapy.  She had a previous trial of Crestor but stopped it after she ran out of the initial prescription.  She did not recall having any adverse side effects from the Crestor.  We are rechecking lab work today.  The patient has a history of essential hypertension she has been on carvedilol 3.125 mg twice a day.  She has normal left surgical systolic function by previous echocardiogram on 03/24/09 which showed an ejection fraction of 55-60% and she had a normal nuclear stress test 04/13/09 showing no evidence of reversible ischemia.  Her ejection fraction on nuclear study was 75%.  The patient has not been expressing any chest pain or angina.  She continues to be troubled with her chronic low back pain despite having had 4 previous back operations.  Is also had symptoms of reflux and sees Dr. Evette Cristal.  She's had a dry she is not on ACE inhibitors  Current Outpatient Prescriptions  Medication Sig Dispense Refill  . carvedilol (COREG) 3.125 MG tablet Take 12.5 mg by mouth 2 (two) times daily.       . chlorpheniramine-HYDROcodone (TUSSIONEX PENNKINETIC ER) 10-8 MG/5ML LQCR Take 5 mLs by mouth every 12 (twelve) hours as needed.  120 mL  0  . diazepam (VALIUM) 5 MG tablet Take 5 mg by mouth every 6 (six) hours as needed. Rx by her back doctor      . DiphenhydrAMINE HCl (BENADRYL PO) Take by mouth as needed.        Marland Kitchen HYDROcodone-acetaminophen (NORCO) 10-325 MG per tablet Take 1 tablet by mouth as needed.        Marland Kitchen omeprazole (PRILOSEC OTC) 20 MG tablet Take 20 mg by mouth as needed.        . zolpidem (AMBIEN) 5 MG tablet Take 5  mg by mouth as needed. 1-2 TABLETS PRN       . losartan (COZAAR) 100 MG tablet Take 1 tablet (100 mg total) by mouth daily.  30 tablet  11    No Known Allergies  Patient Active Problem List  Diagnoses  . Lower back pain  . HTN (hypertension)  . Dyslipidemia  . Mild aortic sclerosis  . Tachycardia  . Bronchitis    History  Smoking status  . Never Smoker   Smokeless tobacco  . Not on file    History  Alcohol Use: Not on file    Family History  Problem Relation Age of Onset  . Stroke Father   . Heart disease Mother     CABG  . Heart attack Mother     Review of Systems: Constitutional: no fever chills diaphoresis or fatigue or change in weight.  Head and neck: no hearing loss, no epistaxis, no photophobia or visual disturbance. Respiratory: No cough, shortness of breath or wheezing. Cardiovascular: No chest pain peripheral edema, palpitations. Gastrointestinal: No abdominal distention, no abdominal pain, no change in bowel habits hematochezia or melena. Genitourinary: No dysuria, no frequency, no urgency, no nocturia. Musculoskeletal:No arthralgias, no back pain, no gait disturbance or myalgias. Neurological: No dizziness, no headaches, no numbness, no  seizures, no syncope, no weakness, no tremors. Hematologic: No lymphadenopathy, no easy bruising. Psychiatric: No confusion, no hallucinations, Mild insomnia is present.    Physical Exam: Filed Vitals:   05/10/11 0952  BP: 150/110  Pulse: 92  The general appearance reveals a well-developed well-nourished woman in no distress.The head and neck exam reveals pupils equal and reactive.  Extraocular movements are full.  There is no scleral icterus.  The mouth and pharynx are normal.  The neck is supple.  The carotids reveal no bruits.  The jugular venous pressure is normal.  The  thyroid is not enlarged.  There is no lymphadenopathy.  The chest is clear to percussion and auscultation.  There are no rales or rhonchi.   Expansion of the chest is symmetrical.  The precordium is quiet.  The first heart sound is normal.  The second heart sound is physiologically split.  There is no murmur gallop rub or click.  There is no abnormal lift or heave.  The abdomen is soft and nontender.  The bowel sounds are normal.  The liver and spleen are not enlarged.  There are no abdominal masses.  There are no abdominal bruits.  Extremities reveal good pedal pulses.  There is no phlebitis or edema.  There is no cyanosis or clubbing.  Strength is normal and symmetrical in all extremities.  There is no lateralizing weakness.  There are no sensory deficits.  The skin is warm and dry.  There is no rash.  EKG today shows normal sinus rhythm and is within normal limits and does not show any LVH with strain   Assessment / Plan: Significant hypertension.  We are checking baseline lab work today including lipid panel hepatic function panel and basal metabolic panel.  We are addingLosartan 100 mg one daily.  We will ask her to return in one month for a followup office visit and basal metabolic panel with Lawson Fiscal.  If her blood pressure remains high we can consider adding a low-dose diuretic as well as going up on her carvedilol dose

## 2011-05-10 NOTE — Assessment & Plan Note (Signed)
The patient has a history of significant essential hypertension.  At the present time the only blood pressure medicine that she is on is a small dose of carvedilol.  She has normal renal function by previous lab work.  She has had a dry cough but is not on any ACE inhibitor.  He has had a lot of acid reflux at night which may be pertinent to her dry cough.She has not been expressing any chest pain or angina.  She does not have known ischemic heart disease and she had a normal nuclear stress test on 04/13/09

## 2011-05-11 ENCOUNTER — Encounter: Payer: Self-pay | Admitting: Cardiology

## 2011-05-11 LAB — HEPATIC FUNCTION PANEL
AST: 25 U/L (ref 0–37)
Total Bilirubin: 0.7 mg/dL (ref 0.3–1.2)

## 2011-05-11 LAB — LDL CHOLESTEROL, DIRECT: Direct LDL: 84.3 mg/dL

## 2011-05-12 ENCOUNTER — Telehealth: Payer: Self-pay | Admitting: *Deleted

## 2011-05-12 ENCOUNTER — Other Ambulatory Visit: Payer: Self-pay | Admitting: *Deleted

## 2011-05-12 DIAGNOSIS — I10 Essential (primary) hypertension: Secondary | ICD-10-CM

## 2011-05-12 MED ORDER — HYDROCHLOROTHIAZIDE 12.5 MG PO CAPS: 12.5000 mg | ORAL_CAPSULE | Freq: Every day | ORAL | Status: DC

## 2011-05-12 NOTE — Telephone Encounter (Signed)
Message copied by Eugenia Pancoast on Fri May 12, 2011 12:18 PM ------      Message from: [name]      Created: Thu May 11, 2011  5:21 PM       Please report.  The cholesterol is still high at 226And the triglycerides are high at 188.The blood sugar is high at 116.  The kidney function is normal And the liver function studies are normal.The LDL cholesterol is Satisfactory at84.3.She does not need to go on statin therapy at this point but does need to continue to watch her diet carefully and also watch her sugars because of the high blood sugar.  Continue the blood pressure medicine that we started at her visit.I also want her to add hydrochlorothiazide12.5 mg one daily to help with blood pressure

## 2011-05-12 NOTE — Telephone Encounter (Signed)
Called Rx. To [facility] [address] for hctz 12.5 mg one daily

## 2011-05-12 NOTE — Telephone Encounter (Signed)
Advised patient of labs.Called new med for B/P HCTZ 12.5 mg. One daily

## 2011-05-17 ENCOUNTER — Other Ambulatory Visit: Payer: Self-pay | Admitting: *Deleted

## 2011-05-17 DIAGNOSIS — I119 Hypertensive heart disease without heart failure: Secondary | ICD-10-CM

## 2011-05-17 MED ORDER — CARVEDILOL 12.5 MG PO TABS: 12.5000 mg | ORAL_TABLET | Freq: Two times a day (BID) | ORAL | Status: DC

## 2011-05-17 NOTE — Telephone Encounter (Signed)
Refilled meds per fax request.  

## 2011-06-13 ENCOUNTER — Ambulatory Visit (INDEPENDENT_AMBULATORY_CARE_PROVIDER_SITE_OTHER): Payer: Medicare Other | Admitting: *Deleted

## 2011-06-13 ENCOUNTER — Ambulatory Visit (INDEPENDENT_AMBULATORY_CARE_PROVIDER_SITE_OTHER): Payer: Medicare Other | Admitting: Nurse Practitioner

## 2011-06-13 ENCOUNTER — Encounter: Payer: Self-pay | Admitting: Nurse Practitioner

## 2011-06-13 VITALS — BP 150/90 | HR 84 | Ht 60.0 in | Wt 134.0 lb

## 2011-06-13 DIAGNOSIS — F32A Depression, unspecified: Secondary | ICD-10-CM | POA: Insufficient documentation

## 2011-06-13 DIAGNOSIS — F329 Major depressive disorder, single episode, unspecified: Secondary | ICD-10-CM

## 2011-06-13 DIAGNOSIS — I10 Essential (primary) hypertension: Secondary | ICD-10-CM

## 2011-06-13 DIAGNOSIS — F3289 Other specified depressive episodes: Secondary | ICD-10-CM

## 2011-06-13 MED ORDER — AMLODIPINE BESYLATE 5 MG PO TABS: 5.0000 mg | ORAL_TABLET | Freq: Every day | ORAL | Status: DC

## 2011-06-13 NOTE — Assessment & Plan Note (Signed)
She has a referral to see psyche in process. She is not suicidal.

## 2011-06-13 NOTE — Progress Notes (Signed)
    Teresa Benson Date of Birth: 01-14-1951   History of Present Illness: Teresa Benson is seen today for her one month check. She is seen for Dr. Patty Sermons. He added Losartan at her last visit. Blood pressure is still up some. She does not check it at home. Her daughter got her a blood pressure machine but it is a wrist unit. No chest pain. She seems to be tolerating her regimen. She admits to being very depressed and is trying to find a psychiatrist. That referral is in process. She is not suicidal. She sleeps poorly. She has chronic pain. She is not exercising. She is taking her medicines. She seems to be tolerating her medicines ok.   Current Outpatient Prescriptions on File Prior to Visit  Medication Sig Dispense Refill  . carvedilol (COREG) 12.5 MG tablet Take 1 tablet (12.5 mg total) by mouth 2 (two) times daily.  60 tablet  11  . diazepam (VALIUM) 5 MG tablet Take 5 mg by mouth every 6 (six) hours as needed. Rx by her back doctor      . hydrochlorothiazide (,MICROZIDE/HYDRODIURIL,) 12.5 MG capsule Take 1 capsule (12.5 mg total) by mouth daily.  30 capsule  11  . HYDROcodone-acetaminophen (NORCO) 10-325 MG per tablet Take 1 tablet by mouth as needed.        Marland Kitchen losartan (COZAAR) 100 MG tablet Take 1 tablet (100 mg total) by mouth daily.  30 tablet  11  . Melatonin 5 MG TABS Take 5 mg by mouth at bedtime.        Marland Kitchen omeprazole (PRILOSEC OTC) 20 MG tablet Take 40 mg by mouth daily.       Marland Kitchen amLODipine (NORVASC) 5 MG tablet Take 1 tablet (5 mg total) by mouth daily.  30 tablet  11    No Known Allergies  Past Medical History  Diagnosis Date  . Lower back pain   . HTN (hypertension)   . Dyslipidemia   . Mild aortic sclerosis   . Tachycardia   . Normal nuclear stress test July 2010  . H/O: GI bleed   . Depression     Past Surgical History  Procedure Date  . Tubal ligation   . Abdominal hysterectomy   . Back surgery     History  Smoking status  . Never Smoker   Smokeless tobacco  . Not  on file    History  Alcohol Use No    Family History  Problem Relation Age of Onset  . Stroke Father   . Heart disease Mother     CABG  . Heart attack Mother     Review of Systems: The review of systems is positive for depressive symptoms. No suicidal ideations. She is sleeping poorly. No cardiac complaints. She has chronic back pain.  All other systems were reviewed and are negative.  Physical Exam: BP 150/90  Pulse 84  Ht 5' (1.524 m)  Wt 134 lb (60.782 kg)  BMI 26.17 kg/m2 Patient is in no acute distress. She is depressed.  Skin is warm and dry. Color is normal.  HEENT is unremarkable. Normocephalic/atraumatic. PERRL. Sclera are nonicteric. Neck is supple. No masses. No JVD. Lungs are clear. Cardiac exam shows a regular rate and rhythm. Abdomen is soft. Extremities are without edema. Gait and ROM are intact. No gross neurologic deficits noted.   LABORATORY DATA: BMET is pending  Assessment / Plan:

## 2011-06-13 NOTE — Assessment & Plan Note (Signed)
Blood pressure remains up. I have added Norvasc 5 mg daily. I will see her back in one month.

## 2011-06-13 NOTE — Patient Instructions (Addendum)
Continue with your current medicines. We are going to add Norvasc 5 mg daily to help with your blood pressure. I will see you in a month Monitor your blood pressure at home. Try to get a blood pressure cuff that goes around your upper arm. Record your readings and bring to your next visit. Limit sodium intake. Call for any problems.

## 2011-06-14 LAB — BASIC METABOLIC PANEL
BUN: 20 mg/dL (ref 6–23)
CO2: 30 mEq/L (ref 19–32)
Calcium: 10 mg/dL (ref 8.4–10.5)
Chloride: 103 mEq/L (ref 96–112)
Creatinine, Ser: 1.2 mg/dL (ref 0.4–1.2)
GFR: 57.08 mL/min — ABNORMAL LOW (ref >60.00)
Glucose, Bld: 87 mg/dL (ref 70–99)
Potassium: 4.8 mEq/L (ref 3.5–5.1)
Sodium: 141 mEq/L (ref 135–145)

## 2011-06-15 ENCOUNTER — Telehealth: Payer: Self-pay | Admitting: *Deleted

## 2011-06-15 NOTE — Telephone Encounter (Signed)
Message copied by Burnell Blanks on Thu Jun 15, 2011 10:09 AM ------      Message from: Rosalio Macadamia      Created: Wed Jun 14, 2011  2:54 PM       Ok to report. Labs are satisfactory.

## 2011-06-15 NOTE — Telephone Encounter (Signed)
Advised of lab results 

## 2011-06-15 NOTE — Progress Notes (Signed)
advised

## 2011-07-04 LAB — CBC
HCT: 35.8 — ABNORMAL LOW
HCT: 35.8 — ABNORMAL LOW
Hemoglobin: 12.2
MCHC: 33.8
MCHC: 34.1
MCV: 88.7
Platelets: 245
Platelets: 265
RDW: 14.9
RDW: 15.1
WBC: 10

## 2011-07-04 LAB — COMPREHENSIVE METABOLIC PANEL
Albumin: 3.3 — ABNORMAL LOW
Alkaline Phosphatase: 74
BUN: 18
Calcium: 8.5
Potassium: 3.4 — ABNORMAL LOW
Total Protein: 7.1

## 2011-07-04 LAB — D-DIMER, QUANTITATIVE: D-Dimer, Quant: 0.92 — ABNORMAL HIGH

## 2011-07-04 LAB — BASIC METABOLIC PANEL
CO2: 20
Calcium: 8.6
Chloride: 109
Glucose, Bld: 93
Potassium: 3.4 — ABNORMAL LOW
Sodium: 136

## 2011-07-04 LAB — DIFFERENTIAL
Basophils Relative: 0
Lymphocytes Relative: 28
Lymphs Abs: 3.2
Monocytes Absolute: 0.8
Monocytes Relative: 7
Neutro Abs: 6.8
Neutrophils Relative %: 60

## 2011-07-04 LAB — TROPONIN I: Troponin I: 0.02

## 2011-07-04 LAB — URINALYSIS, ROUTINE W REFLEX MICROSCOPIC
Ketones, ur: NEGATIVE
Nitrite: NEGATIVE
Protein, ur: NEGATIVE

## 2011-07-04 LAB — CARDIAC PANEL(CRET KIN+CKTOT+MB+TROPI)
CK, MB: 1.8
Relative Index: INVALID
Total CK: 80
Troponin I: 0.01

## 2011-07-04 LAB — CK TOTAL AND CKMB (NOT AT ARMC)
CK, MB: 2
Relative Index: INVALID
Total CK: 83

## 2011-07-04 LAB — C-REACTIVE PROTEIN: CRP: 2.2 — ABNORMAL HIGH (ref <0.6)

## 2011-07-04 LAB — TSH: TSH: 0.873

## 2011-07-19 ENCOUNTER — Ambulatory Visit: Payer: Medicare Other | Admitting: Nurse Practitioner

## 2011-08-11 ENCOUNTER — Encounter: Payer: Self-pay | Admitting: Nurse Practitioner

## 2011-08-11 ENCOUNTER — Ambulatory Visit (INDEPENDENT_AMBULATORY_CARE_PROVIDER_SITE_OTHER): Payer: Medicare Other | Admitting: Nurse Practitioner

## 2011-08-11 VITALS — BP 102/70 | HR 80 | Ht 61.0 in | Wt 129.1 lb

## 2011-08-11 DIAGNOSIS — F329 Major depressive disorder, single episode, unspecified: Secondary | ICD-10-CM

## 2011-08-11 DIAGNOSIS — I10 Essential (primary) hypertension: Secondary | ICD-10-CM

## 2011-08-11 DIAGNOSIS — F32A Depression, unspecified: Secondary | ICD-10-CM

## 2011-08-11 DIAGNOSIS — F3289 Other specified depressive episodes: Secondary | ICD-10-CM

## 2011-08-11 NOTE — Progress Notes (Signed)
    Candie Chroman Date of Birth: 12-Dec-1950 Medical Record #[id number]  History of Present Illness: Kahlani is seen today for a one month check. She is seen for Dr. Patty Sermons. She is doing much better. Blood pressure looks better. Her depression seems better. She is on Cymbalta. She seems more upbeat. Blood pressure has improved and she has had readings below 120 systolic. She has no complaint.  Current Outpatient Prescriptions on File Prior to Visit  Medication Sig Dispense Refill  . amLODipine (NORVASC) 5 MG tablet Take 1 tablet (5 mg total) by mouth daily.  30 tablet  11  . carvedilol (COREG) 12.5 MG tablet Take 1 tablet (12.5 mg total) by mouth 2 (two) times daily.  60 tablet  11  . hydrochlorothiazide (,MICROZIDE/HYDRODIURIL,) 12.5 MG capsule Take 1 capsule (12.5 mg total) by mouth daily.  30 capsule  11  . losartan (COZAAR) 100 MG tablet Take 1 tablet (100 mg total) by mouth daily.  30 tablet  11  . omeprazole (PRILOSEC OTC) 20 MG tablet Take 40 mg by mouth daily.       . Melatonin 5 MG TABS Take 5 mg by mouth at bedtime.          No Known Allergies  Past Medical History  Diagnosis Date  . Lower back pain   . HTN (hypertension)   . Dyslipidemia   . Mild aortic sclerosis   . Tachycardia   . Normal nuclear stress test July 2010  . H/O: GI bleed   . Depression     Past Surgical History  Procedure Date  . Tubal ligation   . Abdominal hysterectomy   . Back surgery     History  Smoking status  . Never Smoker   Smokeless tobacco  . Not on file    History  Alcohol Use No    Family History  Problem Relation Age of Onset  . Stroke Father   . Heart disease Mother     CABG  . Heart attack Mother     Review of Systems: The review of systems is per the HPI. No swelling. No chest pain or shortness of breath. No longer crying.  All other systems were reviewed and are negative.  Physical Exam: BP 102/70  Pulse 80  Ht 5\' 1"  (1.549 m)  Wt 129 lb 1.9 oz (58.568 kg)   BMI 24.40 kg/m2 Patient is very pleasant and in no acute distress. Skin is warm and dry. Color is normal.  HEENT is unremarkable. Normocephalic/atraumatic. PERRL. Sclera are nonicteric. Neck is supple. No masses. No JVD. Lungs are clear. Cardiac exam shows a regular rate and rhythm. Abdomen is soft. Extremities are without edema. Gait and ROM are intact. No gross neurologic deficits noted.   LABORATORY DATA:   Assessment / Plan:

## 2011-08-11 NOTE — Assessment & Plan Note (Signed)
Better with the addition of Cymbalta.

## 2011-08-11 NOTE — Patient Instructions (Addendum)
Continue with your current medicines. Monitor your blood pressure at home. Your goal is to stay less than 135/85.  Record your readings and bring to your next visit. Limit sodium intake. Call for any problems.  We will see you back in 4 months with Dr. Patty Sermons.

## 2011-08-11 NOTE — Assessment & Plan Note (Signed)
Blood pressure looks so much better. She will stay on her current regimen. We will see her back in 4 months. She will monitor at home.Patient is agreeable to this plan and will call if any problems develop in the interim.

## 2011-08-18 ENCOUNTER — Telehealth: Payer: Self-pay | Admitting: Cardiology

## 2011-08-18 DIAGNOSIS — J069 Acute upper respiratory infection, unspecified: Secondary | ICD-10-CM

## 2011-08-18 MED ORDER — AZITHROMYCIN 250 MG PO TABS: ORAL_TABLET | ORAL | Status: AC

## 2011-08-18 NOTE — Telephone Encounter (Signed)
Pt call c/o cough that has lasted the past two weeks. Pt saw Lawson Fiscal 11/2 and she said pt lungs sound good but pt thinks it has gotten worse. Pt was taking coricidin for chest congestion and cough, HBP and pt has had no relief. Pt coughed throughout entire phone conversation and wanted to know if there was a liquid medication pt could take in hopes of some relief. Please return pt call to discuss further.

## 2011-08-18 NOTE — Telephone Encounter (Signed)
Begin Z-Pak

## 2011-08-18 NOTE — Telephone Encounter (Signed)
Nonproductive cough worse since last week. Head congestion, all started about 2 weeks ago. Denies fever. Uses [facility]. Please advise

## 2011-08-18 NOTE — Telephone Encounter (Signed)
Advised and sent Rx to pharmacy.  Also advised if fever develops after starting Zpak, should go to Urgent Care for evaluation.

## 2011-08-22 ENCOUNTER — Telehealth: Payer: Self-pay | Admitting: Cardiology

## 2011-08-22 NOTE — Telephone Encounter (Signed)
Agree with plan 

## 2011-08-22 NOTE — Telephone Encounter (Signed)
Spoke with patient and advised Zithromax in system up to 5 days after finishing antibiotic.  Denies fever, just coughing a lot.  Advised to try OTC Mucinex plain and go to urgent care if worse or fever.  Verbalized understanding

## 2011-08-22 NOTE — Telephone Encounter (Signed)
This is the last day for the Z-Pack and she is still coughing and needs to know what to do

## 2011-09-15 ENCOUNTER — Ambulatory Visit (INDEPENDENT_AMBULATORY_CARE_PROVIDER_SITE_OTHER): Payer: Medicare Other | Admitting: Internal Medicine

## 2011-09-15 ENCOUNTER — Ambulatory Visit (INDEPENDENT_AMBULATORY_CARE_PROVIDER_SITE_OTHER)
Admission: RE | Admit: 2011-09-15 | Discharge: 2011-09-15 | Disposition: A | Discharge status: Home / Self Care | Payer: Medicare Other | Source: Ambulatory Visit | Attending: Internal Medicine | Admitting: Internal Medicine

## 2011-09-15 ENCOUNTER — Institutional Professional Consult (permissible substitution): Payer: Medicare Other | Admitting: Internal Medicine

## 2011-09-15 ENCOUNTER — Encounter: Payer: Self-pay | Admitting: Internal Medicine

## 2011-09-15 VITALS — BP 130/84 | HR 128 | Temp 99.2°F | Ht 61.0 in | Wt 126.2 lb

## 2011-09-15 DIAGNOSIS — J40 Bronchitis, not specified as acute or chronic: Secondary | ICD-10-CM

## 2011-09-15 DIAGNOSIS — I10 Essential (primary) hypertension: Secondary | ICD-10-CM

## 2011-09-15 DIAGNOSIS — I119 Hypertensive heart disease without heart failure: Secondary | ICD-10-CM

## 2011-09-15 MED ORDER — TRAMADOL HCL 50 MG PO TABS: ORAL_TABLET | ORAL | Status: AC

## 2011-09-15 MED ORDER — PREDNISONE (PAK) 10 MG PO TABS: ORAL_TABLET | ORAL | Status: AC

## 2011-09-15 MED ORDER — NEBIVOLOL HCL 5 MG PO TABS: ORAL_TABLET | ORAL | Status: DC

## 2011-09-15 NOTE — Progress Notes (Signed)
  Subjective:    Patient ID: Teresa Benson, female    DOB: [birth year], 60 y.o.   MRN: [id number]  HPI  25 yobf never smoker with pattern of recurrent bronchitis esp in winter with one severe enough to crack ribs around 2000 but in between no cough or sob or need for any inhalers self-referred 12/7/2012for recurrent cough since mid October 2012   09/15/2011 1st pulmonary office eval/ Shelita Steptoe on coreg  cc acute mostly dry onset cough with sensation of post pnds  mid Oct with nl cxr mid nov  [facility]ak  then LUQ pain x 2 days Prior to day of OV  On  prilosec q day before bfast with intermittent overt hb but no active sinus complaints and no h/o childhood allergy or asthma.  NO BETTER on day of ov.   Cough is present 24 h per day s much variation at all - no  obvious fluctuation of symptoms with weather or environmental changes or other aggravating or alleviating factors except as outlined above   Review of Systems  Constitutional: Positive for fever, appetite change and unexpected weight change. Negative for chills.  HENT: Negative for ear pain, nosebleeds, congestion, sore throat, rhinorrhea, sneezing, trouble swallowing, dental problem, voice change, postnasal drip and sinus pressure.   Eyes: Negative for visual disturbance.  Respiratory: Positive for cough and shortness of breath. Negative for choking.   Cardiovascular: Negative for chest pain and leg swelling.  Gastrointestinal: Negative for vomiting, abdominal pain and diarrhea.  Genitourinary: Negative for difficulty urinating.  Musculoskeletal: Negative for arthralgias.  Skin: Negative for rash.  Neurological: Negative for tremors, syncope and headaches.  Hematological: Does not bruise/bleed easily.  Psychiatric/Behavioral: Positive for dysphoric mood.       Objective:   Physical Exam Edentulous bf with honking cough dry sounding Wt 126  09/15/11 HEENT: nl dentition, turbinates, and orophanx. Nl external ear canals without  cough reflex   NECK :  without JVD/Nodes/TM/ nl carotid upstrokes bilaterally   LUNGS: no acc muscle use, clear to A and P bilaterally without cough on insp or exp maneuvers   CV:  RRR  no s3 or murmur or increase in P2, no edema   ABD:  soft and nontender with nl excursion in the supine position. No bruits or organomegaly, bowel sounds nl  MS:  warm without deformities, calf tenderness, cyanosis or clubbing  SKIN: warm and dry without lesions    NEURO:  alert, approp, no deficits    CXR 09/15/11 No active disease.         Assessment & Plan:

## 2011-09-15 NOTE — Patient Instructions (Signed)
Prilosec 20 mg Take x 2  30-60 min before first meal of the day  And Pepcid 20 mg one at bedtime  GERD (REFLUX)  is an extremely common cause of respiratory symptoms, many times with no significant heartburn at all.    It can be treated with medication, but also with lifestyle changes including avoidance of late meals, excessive alcohol, smoking cessation, and avoid fatty foods, chocolate, peppermint, colas, red wine, and acidic juices such as orange juice.  NO MINT OR MENTHOL PRODUCTS SO NO COUGH DROPS  USE SUGARLESS CANDY INSTEAD (jolley ranchers or Stover's)  NO OIL BASED VITAMINS - use powdered substitutes.   Stop corevidol  Bystolic 10 mg one twice daily  Take mucinex dm two every 12 hours and supplement if needed with  tramadol 50 mg up to 2 every 4 hours to suppress the urge to cough. Swallowing water or using ice chips/non mint and menthol containing candies (such as lifesavers or sugarless jolly ranchers) are also effective.  You should rest your voice and avoid activities that you know make you cough.  Once you have eliminated the cough for 3 straight days try reducing the tramadol first,  then the delsym as tolerated.    Prednisone 10 mg take  4 each am x 2 days,   2 each am x 2 days,  1 each am x2days and stop   Please schedule a follow up office visit in 4 weeks, sooner if needed

## 2011-09-16 NOTE — Assessment & Plan Note (Signed)
Strongly prefer in this setting: Bystolic, the most beta -1  selective Beta blocker available in sample form, with bisoprolol the most selective generic choice  on the market.  

## 2011-09-16 NOTE — Assessment & Plan Note (Signed)
The most common causes of chronic cough in immunocompetent adults include the following: upper airway cough syndrome (UACS), previously referred to as postnasal drip syndrome (PNDS), which is caused by variety of rhinosinus conditions; (2) asthma; (3) GERD; (4) chronic bronchitis from cigarette smoking or other inhaled environmental irritants; (5) nonasthmatic eosinophilic bronchitis; and (6) bronchiectasis.   These conditions, singly or in combination, have accounted for up to 94% of the causes of chronic cough in prospective studies.   Other conditions have constituted no >6% of the causes in prospective studies These have included bronchogenic carcinoma, chronic interstitial pneumonia, sarcoidosis, left ventricular failure, ACEI-induced cough, and aspiration from a condition associated with pharyngeal dysfunction.   Doubt this is cough variant asthma though note .Chronic cough is often simultaneously caused by more than one condition. A single cause has been found from 38 to 82% of the time, multiple causes from 18 to 62%. Multiply caused cough has been the result of three diseases up to 42% of the time.    So see HBP rx  Change to bystolic  Of the three most common causes of chronic cough, only one (GERD)  can actually cause the other two (asthma and post nasal drip syndrome)  and perpetuate the cylce of cough inducing airway trauma, inflammation, heightened sensitivity to reflux which is prompted by the cough itself via a cyclical mechanism.    This may partially respond to steroids and look like asthma and post nasal drainage but never erradicated completely unless the cough and the secondary reflux are eliminated, preferably both at the same time.  While not intuitively obvious, many patients with chronic low grade reflux do not cough until there is a secondary insult that disturbs the protective epithelial barrier and exposes sensitive nerve endings.  This can be viral or direct physical injury  such as with an endotracheal tube.   The point is that once this occurs, it is difficult to eliminate using anything but a maximally effective acid suppression regimen at least in the short run, accompanied by an appropriate diet to address non acid GERD.  See instructions for specific recommendations which were reviewed directly with the patient who was given a copy with highlighter outlining the key components.

## 2011-09-18 NOTE — Progress Notes (Signed)
Quick Note:  LMTCB ______ 

## 2011-10-02 ENCOUNTER — Ambulatory Visit: Payer: Medicare Other | Admitting: Internal Medicine

## 2011-12-13 ENCOUNTER — Encounter: Payer: Self-pay | Admitting: Cardiology

## 2011-12-13 ENCOUNTER — Ambulatory Visit (INDEPENDENT_AMBULATORY_CARE_PROVIDER_SITE_OTHER): Payer: Medicare Other | Admitting: Cardiology

## 2011-12-13 VITALS — BP 110/72 | HR 68 | Ht 61.0 in | Wt 126.4 lb

## 2011-12-13 DIAGNOSIS — F329 Major depressive disorder, single episode, unspecified: Secondary | ICD-10-CM

## 2011-12-13 DIAGNOSIS — F32A Depression, unspecified: Secondary | ICD-10-CM

## 2011-12-13 DIAGNOSIS — I10 Essential (primary) hypertension: Secondary | ICD-10-CM

## 2011-12-13 DIAGNOSIS — E785 Hyperlipidemia, unspecified: Secondary | ICD-10-CM

## 2011-12-13 DIAGNOSIS — R05 Cough: Secondary | ICD-10-CM

## 2011-12-13 DIAGNOSIS — I119 Hypertensive heart disease without heart failure: Secondary | ICD-10-CM

## 2011-12-13 DIAGNOSIS — R059 Cough, unspecified: Secondary | ICD-10-CM

## 2011-12-13 NOTE — Progress Notes (Signed)
Teresa Benson Date of Birth:  January 28, 1951 [facility] [address] [place], [place]  [address] [phone]         Fax   [phone]  History of Present Illness: This pleasant 61 year old woman is seen for a scheduled four-month followup office visit.  He is a past history of essential hypertension and dyslipidemia.  She's had a past history of unexplained tachycardia.  She had a normal nuclear stress test in July 2010.  She's had a history of depression since her husband died from cancer in 2009-04-03 and has been on Cymbalta and the dose of Cymbalta was recently increased from 30 mg to 60 mg daily.  Current Outpatient Prescriptions  Medication Sig Dispense Refill  . albuterol (PROAIR HFA) 108 (90 BASE) MCG/ACT inhaler Inhale 2 puffs into the lungs every 6 (six) hours as needed.        Marland Kitchen amLODipine (NORVASC) 5 MG tablet Take 5 mg by mouth daily.       . DULoxetine (CYMBALTA) 60 MG capsule Take 60 mg by mouth daily.      . famotidine (PEPCID) 20 MG tablet Take 20 mg by mouth 2 (two) times daily as needed.      . hydrochlorothiazide (,MICROZIDE/HYDRODIURIL,) 12.5 MG capsule Take 1 capsule (12.5 mg total) by mouth daily.  30 capsule  11  . losartan (COZAAR) 25 MG tablet Take 25 mg by mouth daily.      . metoprolol (LOPRESSOR) 100 MG tablet Take 100 mg by mouth 2 (two) times daily.       . Multiple Vitamins-Calcium (ONE-A-DAY WOMENS FORMULA PO) Take 1 tablet by mouth daily.        Marland Kitchen omeprazole (PRILOSEC OTC) 20 MG tablet Take 40 mg by mouth daily.       Marland Kitchen zolpidem (AMBIEN CR) 6.25 MG CR tablet Take 6.25 mg by mouth at bedtime.          No Known Allergies  Patient Active Problem List  Diagnoses  . Lower back pain  . HTN (hypertension)  . Dyslipidemia  . Mild aortic sclerosis  . Tachycardia  . Cough  . Depression    History  Smoking status  . Never Smoker   Smokeless tobacco  . Never Used  Comment: Lived with a smoker for at least 20 years    History    Alcohol Use No    Family History  Problem Relation Age of Onset  . Stroke Father   . Heart disease Mother     CABG  . Heart attack Mother   . Asthma Grandchild     Review of Systems: Constitutional: no fever chills diaphoresis or fatigue or change in weight.  Head and neck: no hearing loss, no epistaxis, no photophobia or visual disturbance. Respiratory: No cough, shortness of breath or wheezing. Cardiovascular: No chest pain peripheral edema, palpitations. Gastrointestinal: No abdominal distention, no abdominal pain, no change in bowel habits hematochezia or melena. Genitourinary: No dysuria, no frequency, no urgency, no nocturia. Musculoskeletal:No arthralgias, no back pain, no gait disturbance or myalgias. Neurological: No dizziness, no headaches, no numbness, no seizures, no syncope, no weakness, no tremors. Hematologic: No lymphadenopathy, no easy bruising. Psychiatric: No confusion, no hallucinations, no sleep disturbance.    Physical Exam: Filed Vitals:   12/13/11 0858  BP: 110/72  Pulse: 68   the general appearance reveals a well-developed well-nourished woman in no distress.  She is in good spirits today and feels much better than usual.Pupils equal and  reactive.   Extraocular Movements are full.  There is no scleral icterus.  The mouth and pharynx are normal.  The neck is supple.  The carotids reveal no bruits.  The jugular venous pressure is normal.  The thyroid is not enlarged.  There is no lymphadenopathy.  The chest is clear to percussion and auscultation. There are no rales or rhonchi. Expansion of the chest is symmetrical.  The precordium is quiet.  The first heart sound is normal.  The second heart sound is physiologically split.  There is no murmur gallop rub or click.  There is no abnormal lift or heave.  The abdomen is soft and nontender. Bowel sounds are normal. The liver and spleen are not enlarged. There Are no abdominal masses. There are no  bruits.  The pedal pulses are good.  There is no phlebitis or edema.  There is no cyanosis or clubbing. Strength is normal and symmetrical in all extremities.  There is no lateralizing weakness.  There are no sensory deficits.  The skin is warm and dry.  There is no rash.    Assessment / Plan: The patient is to continue same medications and same careful diet.  We will see her in 4 months for followup office visit EKG and fasting lab work.

## 2011-12-13 NOTE — Assessment & Plan Note (Addendum)
The patient has a past history of essential hypertension and tachycardia.  Since we last saw her she was admitted to a hospital in [place] for fast heart rate.  She states that she was kept in the hospital for about a week.  She is not aware as to what tests were done nor what medications were prescribed.  Presently she is back on her previous medications and is feeling well.  She attributes her feeling of good health to the fact that she is now using a blender and making juices twice a day and also eating organic foods.  She previously had had a trial of the Bystolic but did not continue it when the prescription ran out and she went back to taking her metoprolol 100 mg twice a day.

## 2011-12-13 NOTE — Assessment & Plan Note (Signed)
The patient has a history of dyslipidemia.  She is not presently on any statin at.  In the past we have given her Crestor which she stopped taking because it made her feel weak

## 2011-12-13 NOTE — Patient Instructions (Signed)
Your physician recommends that you continue on your current medications as directed. Please refer to the Current Medication list given to you today. Your physician wants you to follow-up in: 4 month You will receive a reminder letter in the mail two months in advance. If you don't receive a letter, please call our office to schedule the follow-up appointment.  

## 2011-12-13 NOTE — Assessment & Plan Note (Signed)
The patient states that her cough is 80% better than it was when she was admitted to the hospital in [place].

## 2011-12-25 ENCOUNTER — Ambulatory Visit (INDEPENDENT_AMBULATORY_CARE_PROVIDER_SITE_OTHER): Payer: Medicare Other | Admitting: Internal Medicine

## 2011-12-25 ENCOUNTER — Encounter: Payer: Self-pay | Admitting: Internal Medicine

## 2011-12-25 VITALS — BP 118/78 | HR 86 | Temp 98.2°F | Ht 61.0 in | Wt 128.0 lb

## 2011-12-25 DIAGNOSIS — R059 Cough, unspecified: Secondary | ICD-10-CM

## 2011-12-25 DIAGNOSIS — R05 Cough: Secondary | ICD-10-CM

## 2011-12-25 DIAGNOSIS — I10 Essential (primary) hypertension: Secondary | ICD-10-CM

## 2011-12-25 LAB — PULMONARY FUNCTION TEST

## 2011-12-25 MED ORDER — TRAMADOL HCL 50 MG PO TABS: ORAL_TABLET | ORAL | Status: DC

## 2011-12-25 MED ORDER — PREDNISONE (PAK) 10 MG PO TABS: ORAL_TABLET | ORAL | Status: AC

## 2011-12-25 MED ORDER — MONTELUKAST SODIUM 10 MG PO TABS: 10.0000 mg | ORAL_TABLET | Freq: Every day | ORAL | Status: DC

## 2011-12-25 NOTE — Assessment & Plan Note (Signed)
With nl pft's reluctant to stop lopressor but Strongly prefer in this setting: Bystolic, the most beta -1  selective Beta blocker available in sample form, with bisoprolol the most selective generic choice  on the market if pulse control would allow.

## 2011-12-25 NOTE — Patient Instructions (Addendum)
Singulair 10 mg one daily until you return Stay on prilosec x 2 x 30 min before first meal and pepcid 20 mg at bedtime Prednisone 10 mg take  4 each am x 2 days,   2 each am x 2 days,  1 each am x2days and stop   If start bad cough, first start using the flutter valve  Take delsym two tsp every 12 hours and supplement if needed with  tramadol 50 mg up to 2 every 4 hours to suppress the urge to cough. Swallowing water or using ice chips/non mint and menthol containing candies (such as lifesavers or sugarless jolly ranchers) are also effective.  You should rest your voice and avoid activities that you know make you cough.  Once you have eliminated the cough for 3 straight days try reducing the tramadol first,  then the delsym as tolerated.    Please schedule a follow up office visit in 4 weeks, sooner if needed

## 2011-12-25 NOTE — Progress Notes (Signed)
PFT done today. 

## 2011-12-25 NOTE — Progress Notes (Signed)
Subjective:    Patient ID: Teresa Benson, female    DOB: 1951-07-01    MRN: [id number]  HPI  64 yobf never smoker with pattern of recurrent bronchitis esp in winter with one severe enough to crack ribs around 2000 but in between no cough or sob or need for any inhalers self-referred 09/15/2011 for recurrent cough since mid October 2012   09/15/2011 1st pulmonary office eval/ Teresa Benson on coreg  cc acute mostly dry onset cough with sensation of post pnds  mid Oct with nl cxr mid nov  [facility]  then LUQ pain x 2 days Prior to day of OV  On  prilosec q day before bfast with intermittent overt hb but no active sinus complaints and no h/o childhood allergy or asthma.  NO BETTER on day of ov.   Cough is present 24 h per day s much variation at all - no  obvious fluctuation of symptoms with weather or environmental changes or other aggravating or alleviating factors except as outlined above. rec Prilosec 20 mg Take x 2  30-60 min before first meal of the day  And Pepcid 20 mg one at bedtime GERD   Stop corevidol Bystolic 10 mg one twice daily Take mucinex dm two every 12 hours and supplement if needed with  tramadol 50 mg up to 2 every 4 hours  Once you have eliminated the cough for 3 straight days try reducing the tramadol first,  then the delsym as tolerated.   Prednisone 10 mg take  4 each am x 2 days,   2 each am x 2 days,  1 each am x2days and stop  11/2011 admitted [name] x one week no dx but ended up off bystolic and on lopressor 100 bid due to tachycardia apparently   12/25/2011 f/u ov/Teresa Benson back on high dose lopressor twice daily  cc cough back for 2 weeks mostly dry, has trouble in the allergy season with lots of itching sneezing but not typically coughing. Using albuterol sev times a week was told to consider neb albuterol ? By same doctor who rec high dose lopressor? Not sure. [place].  No sob unless coughing  In general, Sleeping ok  without nocturnal  or early am exacerbation  of respiratory  c/o's or need for noct saba. Also denies any obvious fluctuation of symptoms with weather or environmental changes or other aggravating or alleviating factors except as outlined above   ROS  At present neg for  any significant sore throat, dysphagia, itching, sneezing,  nasal congestion or excess/ purulent secretions,  fever, chills, sweats, unintended wt loss, pleuritic or exertional cp, hempoptysis, orthopnea pnd or leg swelling.  Also denies presyncope, palpitations, heartburn, abdominal pain, nausea, vomiting, diarrhea  or change in bowel or urinary habits, dysuria,hematuria,  rash, arthralgias, visual complaints, headache, numbness weakness or ataxia.           Objective:   Physical Exam  Edentulous bf nad no longer coughing  Wt 126  09/15/11 > 12/25/2011  128   HEENT: nl dentition, turbinates, and orophanx. Nl external ear canals without cough reflex   NECK :  without JVD/Nodes/TM/ nl carotid upstrokes bilaterally   LUNGS: no acc muscle use, clear to A and P bilaterally without cough on insp or exp maneuvers   CV:  RRR  no s3 or murmur or increase in P2, no edema   ABD:  soft and nontender with nl excursion  in the supine position. No bruits or organomegaly, bowel sounds nl  MS:  warm without deformities, calf tenderness, cyanosis or clubbing  SKIN: warm and dry without lesions    NEURO:  alert, approp, no deficits    CXR 09/15/11 No active disease.         Assessment & Plan:

## 2011-12-25 NOTE — Assessment & Plan Note (Signed)
The most common causes of chronic cough in immunocompetent adults include the following: upper airway cough syndrome (UACS), previously referred to as postnasal drip syndrome (PNDS), which is caused by variety of rhinosinus conditions; (2) asthma; (3) GERD; (4) chronic bronchitis from cigarette smoking or other inhaled environmental irritants; (5) nonasthmatic eosinophilic bronchitis; and (6) bronchiectasis.   These conditions, singly or in combination, have accounted for up to 94% of the causes of chronic cough in prospective studies.   Other conditions have constituted no >6% of the causes in prospective studies These have included bronchogenic carcinoma, chronic interstitial pneumonia, sarcoidosis, left ventricular failure, ACEI-induced cough, and aspiration from a condition associated with pharyngeal dysfunction.    Chronic cough is often simultaneously caused by more than one condition. A single cause has been found from 38 to 82% of the time, multiple causes from 18 to 62%. Multiply caused cough has been the result of three diseases up to 42% of the time.     So for now will continue rx for gerd and add singulair to cover ? Cough variant asthma and rhinitis/ pnds with next step consider methacholine challenge testing though may be wiser to change to bisoprolol first.

## 2012-01-04 ENCOUNTER — Encounter: Payer: Self-pay | Admitting: Internal Medicine

## 2012-01-22 ENCOUNTER — Ambulatory Visit: Payer: Medicare Other | Admitting: Internal Medicine

## 2012-01-29 ENCOUNTER — Other Ambulatory Visit: Payer: Self-pay | Admitting: Cardiology

## 2012-02-20 ENCOUNTER — Other Ambulatory Visit: Payer: Self-pay | Admitting: Cardiology

## 2012-02-23 ENCOUNTER — Telehealth: Payer: Self-pay | Admitting: Cardiology

## 2012-02-23 NOTE — Telephone Encounter (Signed)
error 

## 2012-05-16 ENCOUNTER — Other Ambulatory Visit (INDEPENDENT_AMBULATORY_CARE_PROVIDER_SITE_OTHER): Payer: Medicare Other

## 2012-05-16 ENCOUNTER — Other Ambulatory Visit: Payer: Self-pay | Admitting: *Deleted

## 2012-05-16 DIAGNOSIS — I10 Essential (primary) hypertension: Secondary | ICD-10-CM

## 2012-05-16 DIAGNOSIS — E785 Hyperlipidemia, unspecified: Secondary | ICD-10-CM

## 2012-05-16 DIAGNOSIS — I119 Hypertensive heart disease without heart failure: Secondary | ICD-10-CM

## 2012-05-16 DIAGNOSIS — G4700 Insomnia, unspecified: Secondary | ICD-10-CM

## 2012-05-16 LAB — LIPID PANEL
Cholesterol: 138 mg/dL (ref 0–200)
LDL Cholesterol: 62 mg/dL (ref 0–99)
Total CHOL/HDL Ratio: 2
Triglycerides: 89 mg/dL (ref 0.0–149.0)
VLDL: 17.8 mg/dL (ref 0.0–40.0)

## 2012-05-16 LAB — BASIC METABOLIC PANEL
CO2: 26 mEq/L (ref 19–32)
Calcium: 9.1 mg/dL (ref 8.4–10.5)
Creatinine, Ser: 1.1 mg/dL (ref 0.4–1.2)
GFR: 64.73 mL/min (ref >60.00)
Glucose, Bld: 99 mg/dL (ref 70–99)
Sodium: 138 mEq/L (ref 135–145)

## 2012-05-16 LAB — HEPATIC FUNCTION PANEL
AST: 30 U/L (ref 0–37)
Bilirubin, Direct: 0 mg/dL (ref 0.0–0.3)
Total Bilirubin: 0.5 mg/dL (ref 0.3–1.2)

## 2012-05-16 MED ORDER — HYDROCHLOROTHIAZIDE 12.5 MG PO CAPS: 12.5000 mg | ORAL_CAPSULE | Freq: Every day | ORAL | Status: DC

## 2012-05-16 MED ORDER — ZOLPIDEM TARTRATE ER 6.25 MG PO TBCR: 6.2500 mg | EXTENDED_RELEASE_TABLET | Freq: Every day | ORAL | Status: DC

## 2012-05-16 NOTE — Progress Notes (Signed)
Quick Note:  Please make copy of labs for patient visit. ______ 

## 2012-05-16 NOTE — Telephone Encounter (Signed)
Refilled zolpidem and hctz

## 2012-05-17 ENCOUNTER — Ambulatory Visit (INDEPENDENT_AMBULATORY_CARE_PROVIDER_SITE_OTHER): Payer: Medicare Other | Admitting: Cardiology

## 2012-05-17 ENCOUNTER — Encounter: Payer: Self-pay | Admitting: Cardiology

## 2012-05-17 VITALS — BP 110/78 | HR 66 | Ht 61.0 in | Wt 136.0 lb

## 2012-05-17 DIAGNOSIS — I10 Essential (primary) hypertension: Secondary | ICD-10-CM

## 2012-05-17 DIAGNOSIS — R000 Tachycardia, unspecified: Secondary | ICD-10-CM

## 2012-05-17 DIAGNOSIS — E785 Hyperlipidemia, unspecified: Secondary | ICD-10-CM

## 2012-05-17 NOTE — Patient Instructions (Addendum)
Your physician recommends that you continue on your current medications as directed. Please refer to the Current Medication list given to you today.  Your physician wants you to follow-up in: 6 month. You will receive a reminder letter in the mail two months in advance. If you don't receive a letter, please call our office to schedule the follow-up appointment.  

## 2012-05-17 NOTE — Assessment & Plan Note (Signed)
The patient has had a remarkable improvement in her lipid panel since last visit.  She attributes this to be heart healthy diet.  She is not on any statin therapy

## 2012-05-17 NOTE — Assessment & Plan Note (Signed)
Her blood pressure is now normal on current therapy.  She is on a heart healthy diet.

## 2012-05-17 NOTE — Progress Notes (Signed)
Teresa Benson Date of Birth:  03/10/1951 [facility] [address] [place], [place]  [address] [phone]  Fax   [phone]  HPI: This pleasant 61 year old woman is seen for a scheduled followup office visit.  She has a past history of labile hypertension and hypercholesterolemia.  In the past she has also had unexplained tachycardia.  She does not have any history of ischemic heart disease and she had a normal nuclear stress test July 2010  Current Outpatient Prescriptions  Medication Sig Dispense Refill  . albuterol (PROAIR HFA) 108 (90 BASE) MCG/ACT inhaler Inhale 2 puffs into the lungs every 6 (six) hours as needed.        Marland Kitchen amLODipine (NORVASC) 5 MG tablet Take 5 mg by mouth daily.       . DULoxetine (CYMBALTA) 60 MG capsule Take 60 mg by mouth daily.      . hydrochlorothiazide (MICROZIDE) 12.5 MG capsule Take 1 capsule (12.5 mg total) by mouth daily.  30 capsule  11  . losartan (COZAAR) 25 MG tablet TAKE 1 TABLET EVERY DAY  30 tablet  8  . metoprolol (LOPRESSOR) 100 MG tablet TAKE 1 TABLET TWICE A DAY  60 tablet  5  . Multiple Vitamins-Calcium (ONE-A-DAY WOMENS FORMULA PO) Take 1 tablet by mouth daily.        Marland Kitchen omeprazole (PRILOSEC OTC) 20 MG tablet Take 40 mg by mouth daily.       Marland Kitchen zolpidem (AMBIEN CR) 6.25 MG CR tablet Take 1 tablet (6.25 mg total) by mouth at bedtime.  30 tablet  4    Allergies  Allergen Reactions  . Crestor (Rosuvastatin Calcium)     Weakness    Patient Active Problem List  Diagnosis  . Lower back pain  . HTN (hypertension)  . Dyslipidemia  . Mild aortic sclerosis  . Tachycardia  . Cough  . Depression    History  Smoking status  . Never Smoker   Smokeless tobacco  . Never Used  Comment: Lived with a smoker for at least 20 years    History  Alcohol Use No    Family History  Problem Relation Age of Onset  . Stroke Father   . Heart disease Mother     CABG  . Heart attack Mother   . Asthma Grandchild      Review of Systems: The patient denies any heat or cold intolerance.  No weight gain or weight loss.  The patient denies headaches or blurry vision.  There is no cough or sputum production.  The patient denies dizziness.  There is no hematuria or hematochezia.  The patient denies any muscle aches or arthritis.  The patient denies any rash.  The patient denies frequent falling or instability.  There is no history of depression or anxiety.  All other systems were reviewed and are negative.   Physical Exam: Filed Vitals:   05/17/12 1557  BP: 110/78  Pulse: 66   the general appearance reveals a well-developed well-nourished woman in no distress.The head and neck exam reveals pupils equal and reactive.  Extraocular movements are full.  There is no scleral icterus.  The mouth and pharynx are normal.  The neck is supple.  The carotids reveal no bruits.  The jugular venous pressure is normal.  The  thyroid is not enlarged.  There is no lymphadenopathy.  The chest is clear to percussion and auscultation.  There are no rales or rhonchi.  Expansion of the chest is symmetrical.  The precordium is quiet.  The first heart sound is normal.  The second heart sound is physiologically split.  There is no murmur gallop rub or click.  There is no abnormal lift or heave.  The abdomen is soft and nontender.  The bowel sounds are normal.  The liver and spleen are not enlarged.  There are no abdominal masses.  There are no abdominal bruits.  Extremities reveal good pedal pulses.  There is no phlebitis or edema.  There is no cyanosis or clubbing.  Strength is normal and symmetrical in all extremities.  There is no lateralizing weakness.  There are no sensory deficits.  The skin is warm and dry.  There is no rash.   EKG today shows normal sinus rhythm and is within normal limits   Assessment / Plan:  Recheck at 6 month intervals.  Continue same medication.  BUN is slightly high so she needs to increase water intake.   Recheck 6 months for followup office visit lipid panel hepatic function panel and basal metabolic panel

## 2012-05-17 NOTE — Assessment & Plan Note (Signed)
She is no longer experiencing tachycardia.  EKG today shows heart rate of 66 per minute

## 2012-05-29 ENCOUNTER — Other Ambulatory Visit: Payer: Self-pay | Admitting: *Deleted

## 2012-05-29 MED ORDER — LOSARTAN POTASSIUM 25 MG PO TABS: 25.0000 mg | ORAL_TABLET | Freq: Every day | ORAL | Status: DC

## 2012-06-12 ENCOUNTER — Other Ambulatory Visit: Payer: Self-pay | Admitting: Nurse Practitioner

## 2012-07-10 ENCOUNTER — Ambulatory Visit (INDEPENDENT_AMBULATORY_CARE_PROVIDER_SITE_OTHER): Payer: Medicare Other | Admitting: Internal Medicine

## 2012-07-10 ENCOUNTER — Encounter: Payer: Self-pay | Admitting: Internal Medicine

## 2012-07-10 ENCOUNTER — Ambulatory Visit (INDEPENDENT_AMBULATORY_CARE_PROVIDER_SITE_OTHER)
Admission: RE | Admit: 2012-07-10 | Discharge: 2012-07-10 | Disposition: A | Discharge status: Home / Self Care | Payer: Medicare Other | Source: Ambulatory Visit | Attending: Internal Medicine | Admitting: Internal Medicine

## 2012-07-10 VITALS — BP 140/90 | HR 74 | Temp 99.3°F | Ht 61.0 in | Wt 139.6 lb

## 2012-07-10 DIAGNOSIS — I10 Essential (primary) hypertension: Secondary | ICD-10-CM

## 2012-07-10 DIAGNOSIS — R05 Cough: Secondary | ICD-10-CM

## 2012-07-10 DIAGNOSIS — Z23 Encounter for immunization: Secondary | ICD-10-CM

## 2012-07-10 DIAGNOSIS — R059 Cough, unspecified: Secondary | ICD-10-CM

## 2012-07-10 MED ORDER — PREDNISONE (PAK) 10 MG PO TABS: ORAL_TABLET | ORAL | Status: DC

## 2012-07-10 MED ORDER — BISOPROLOL FUMARATE 10 MG PO TABS: 10.0000 mg | ORAL_TABLET | Freq: Every day | ORAL | Status: DC

## 2012-07-10 MED ORDER — MONTELUKAST SODIUM 10 MG PO TABS: 10.0000 mg | ORAL_TABLET | Freq: Every day | ORAL | Status: DC

## 2012-07-10 MED ORDER — TRAMADOL HCL 50 MG PO TABS: ORAL_TABLET | ORAL | Status: AC

## 2012-07-10 NOTE — Progress Notes (Signed)
Quick Note:  Called and spoke with patient. Informed her of results/recs per Dr. Sherene Sires as listed below. Patient verbalized understanding and nothing further needed at this time. ______

## 2012-07-10 NOTE — Progress Notes (Signed)
Subjective:    Patient ID: Teresa Benson, female    DOB: [birth year]    MRN: [id number]  HPI  76 yobf never smoker with pattern of recurrent bronchitis esp in winter with one severe enough to crack ribs around 2000 but in between no cough or sob or need for any inhalers self-referred 09/15/2011 for recurrent cough since mid October 2012   09/15/2011 1st pulmonary office eval/ Beckhem Isadore on coreg  cc acute mostly dry onset cough with sensation of post pnds  mid Oct with nl cxr mid nov  at [facility] pak  then LUQ pain x 2 days Prior to day of OV  On  prilosec q day before bfast with intermittent overt hb but no active sinus complaints and no h/o childhood allergy or asthma.  NO BETTER on day of ov.   Cough is present 24 h per day s much variation at all - no  obvious fluctuation of symptoms with weather or environmental changes or other aggravating or alleviating factors except as outlined above. rec Prilosec 20 mg Take x 2  30-60 min before first meal of the day  And Pepcid 20 mg one at bedtime GERD   Stop corevidol Bystolic 10 mg one twice daily Take mucinex dm two every 12 hours and supplement if needed with  tramadol 50 mg up to 2 every 4 hours  Once you have eliminated the cough for 3 straight days try reducing the tramadol first,  then the delsym as tolerated.   Prednisone 10 mg take  4 each am x 2 days,   2 each am x 2 days,  1 each am x2days and stop  11/2011 admitted [name] x one week no dx but ended up off bystolic and on lopressor 100 bid due to tachycardia apparently   12/25/2011 f/u ov/Ramiel Forti back on high dose lopressor twice daily  cc cough back for 2 weeks mostly dry, has trouble in the allergy season with lots of itching sneezing but not typically coughing. Using albuterol sev times a week was told to consider neb albuterol ? By same doctor who rec high dose lopressor? Not sure. [place].  No sob unless coughing rec Singulair 10 mg one  daily until you return Stay on prilosec x 2 x 30 min before first meal and pepcid 20 mg at bedtime Prednisone 10 mg take  4 each am x 2 days,   2 each am x 2 days,  1 each am x2days and stop  If start bad cough, first start using the flutter valve Take delsym two tsp every 12 hours and supplement if needed with  tramadol 50 mg up to 2 every 4 hours F/u 4 weeks > did not return   07/10/2012 f/u ov/Jood Retana not on singulair/ did stay  on prilosec 20mg  bid ac and pepcid one at bedtime does not know dose but continued to have severe cough "every few months". Present Cough x 7 days typical  honking dry sound, not using flutter, no better with proventil which ran out 9/29  In general, Sleeping ok without nocturnal  or early am exacerbation  of respiratory  c/o's or need for noct saba. Also denies any obvious fluctuation of symptoms with weather or environmental changes or other aggravating or alleviating factors except as outlined above   ROS  The following are not active complaints unless bolded sore throat, dysphagia, dental problems, itching, sneezing,  nasal congestion or excess/ purulent secretions,  ear ache,   fever, chills, sweats, unintended wt loss, pleuritic or exertional cp, hemoptysis,  orthopnea pnd or leg swelling, presyncope, palpitations, heartburn, abdominal pain, anorexia, nausea, vomiting, diarrhea  or change in bowel or urinary habits, change in stools or urine, dysuria,hematuria,  rash, arthralgias, visual complaints, headache, numbness weakness or ataxia or problems with walking or coordination,  change in mood/affect or memory.               Objective:   Physical Exam  Edentulous bf nad honking dry sounding upper airway cough  Wt 126  09/15/11 > 12/25/2011  128 > 07/10/2012  139  HEENT: nl dentition, turbinates, and orophanx. Nl external ear canals without cough reflex   NECK :  without JVD/Nodes/TM/ nl carotid upstrokes bilaterally   LUNGS: no acc muscle use, clear to A  and P bilaterally without cough on insp or exp maneuvers   CV:  RRR  no s3 or murmur or increase in P2, no edema   ABD:  soft and nontender with nl excursion in the supine position. No bruits or organomegaly, bowel sounds nl  MS:  warm without deformities, calf tenderness, cyanosis or clubbing        CXR  07/10/2012 :  No active cardiopulmonary disease.           Assessment & Plan:

## 2012-07-10 NOTE — Patient Instructions (Addendum)
Singulair 10 mg one daily until you return Stay on prilosec 30 min before first meal and last meal and pepcid 20 mg at bedtime Prednisone 10 mg take  4 each am x 2 days,   2 each am x 2 days,  1 each am x2days and stop   If start bad cough, first start using the flutter valve and bring it back with you  Take delsym two tsp every 12 hours and supplement if needed with  tramadol 50 mg up to 2 every 4 hours to suppress the urge to cough. Swallowing water or using ice chips/non mint and menthol containing candies (such as lifesavers or sugarless jolly ranchers) are also effective.  You should rest your voice and avoid activities that you know make you cough.  Once you have eliminated the cough for 3 straight days try reducing the tramadol first,  then the delsym as tolerated.    Stop lopressor and start bisoprolol 10 mg twice daily  Please remember to go to  x-ray department downstairs for your tests - we will call you with the results when they are available.     See Tammy NP w/in 1 weeks with all your medications, even over the counter meds, separated in two separate bags, the ones you take no matter what vs the ones you stop once you feel better and take only as needed when you feel you need them.   Tammy  will generate for you a new user friendly medication calendar that will put [place] all on the same page re: your medication use.     Without this process, it simply isn't possible to assure that we are providing  your outpatient care  with  the attention to detail we feel you deserve.   If we cannot assure that you're getting that kind of care,  then we cannot manage your problem effectively from this clinic.  Once you have seen Tammy and we are sure that we're all on the same page with your medication use she will arrange follow up with me.

## 2012-07-11 NOTE — Assessment & Plan Note (Signed)
-   PFT's wnl 12/25/2011    - Trial of singulair maint rx 12/25/2011  > did not maintain  Symptoms are markedly disproportionate to objective findings and not clear this is a lung problem but pt does appear to have difficult airway management issues. DDX of  difficult airways managment all start with A and  include Adherence, Ace Inhibitors, Acid Reflux, Active Sinus Disease, Alpha 1 Antitripsin deficiency, Anxiety masquerading as Airways dz,  ABPA,  allergy(esp in young), Aspiration (esp in elderly), Adverse effects of DPI,  Active smokers, plus two Bs  = Bronchiectasis and Beta blocker use..and one C= CHF  ? Acid reflux > appears to be well treated, just need to document compliance  ? Beta blocker effect > on very high dose BB > see HBP  Rx as cyclical cough and regroup in one week -   To keep things simple, I have asked the patient to first separate medicines that are perceived as maintenance, that is to be taken daily "no matter what", from those medicines that are taken on only on an as-needed basis and I have given the patient examples of both, and then return to see our NP to generate a  detailed  medication calendar which should be followed until the next physician sees the patient and updates it.

## 2012-07-11 NOTE — Assessment & Plan Note (Signed)
Change to bystolic 09/16/2011 due to concern ? Cough variant asthma Changed bystolic to lopressor in high doses for tachycardia 11/2011 - Recurrent severe cough so changed from lopressor 100 bid to bisoprolol 10 mg bid with f/u in one week  The tachycardia issue will be easier to manage off excess  B2's  Which probably won't be necessary once we get her on selective B1's

## 2012-07-16 ENCOUNTER — Encounter: Payer: Self-pay | Admitting: Adult Health

## 2012-07-16 ENCOUNTER — Ambulatory Visit (INDEPENDENT_AMBULATORY_CARE_PROVIDER_SITE_OTHER): Payer: Medicare Other | Admitting: Adult Health

## 2012-07-16 VITALS — BP 116/74 | HR 69 | Temp 97.4°F | Ht 61.0 in | Wt 142.8 lb

## 2012-07-16 DIAGNOSIS — R05 Cough: Secondary | ICD-10-CM

## 2012-07-16 DIAGNOSIS — R059 Cough, unspecified: Secondary | ICD-10-CM

## 2012-07-16 DIAGNOSIS — I10 Essential (primary) hypertension: Secondary | ICD-10-CM

## 2012-07-16 NOTE — Progress Notes (Signed)
Subjective:    Patient ID: Teresa Benson, female    DOB: 08-28-1951    MRN: [id number] HPI 54 yobf never smoker with pattern of recurrent bronchitis esp in winter with one severe enough to crack ribs around 2000 but in between no cough or sob or need for any inhalers self-referred 09/15/2011 for recurrent cough since mid October 2012   09/15/2011 1st pulmonary office eval/ Wert on coreg  cc acute mostly dry onset cough with sensation of post pnds  mid Oct with nl cxr mid nov  [facility]ak  then LUQ pain x 2 days Prior to day of OV  On  prilosec q day before bfast with intermittent overt hb but no active sinus complaints and no h/o childhood allergy or asthma.  NO BETTER on day of ov.   Cough is present 24 h per day s much variation at all - no  obvious fluctuation of symptoms with weather or environmental changes or other aggravating or alleviating factors except as outlined above. rec Prilosec 20 mg Take x 2  30-60 min before first meal of the day  And Pepcid 20 mg one at bedtime GERD   Stop corevidol Bystolic 10 mg one twice daily Take mucinex dm two every 12 hours and supplement if needed with  tramadol 50 mg up to 2 every 4 hours  Once you have eliminated the cough for 3 straight days try reducing the tramadol first,  then the delsym as tolerated.   Prednisone 10 mg take  4 each am x 2 days,   2 each am x 2 days,  1 each am x2days and stop  11/2011 admitted [name] x one week no dx but ended up off bystolic and on lopressor 100 bid due to tachycardia apparently   12/25/2011 f/u ov/Wert back on high dose lopressor twice daily  cc cough back for 2 weeks mostly dry, has trouble in the allergy season with lots of itching sneezing but not typically coughing. Using albuterol sev times a week was told to consider neb albuterol ? By same doctor who rec high dose lopressor? Not sure. [place].  No sob unless coughing rec Singulair 10 mg one daily  until you return Stay on prilosec x 2 x 30 min before first meal and pepcid 20 mg at bedtime Prednisone 10 mg take  4 each am x 2 days,   2 each am x 2 days,  1 each am x2days and stop  If start bad cough, first start using the flutter valve Take delsym two tsp every 12 hours and supplement if needed with  tramadol 50 mg up to 2 every 4 hours F/u 4 weeks > did not return   07/10/2012 f/u ov/Wert not on singulair/ did stay  on prilosec 20mg  bid ac and pepcid one at bedtime does not know dose but continued to have severe cough "every few months". Present Cough x 7 days typical  honking dry sound, not using flutter, no better with proventil which ran out 9/29 >>changed lopressor to bisoprolol , added singulair, tramadol/delsym for cough , pred taper   07/16/2012 Follow up and med calendar  Patient returns for a one-week followup for cough. She is brought all of her medications for a medication review. We reviewed all her medications and organized them into a medication calendar with patient education. Last visit. Patient was changed from Lopressor to bisoprolol. However, patient misunderstood instructions and took those  prescriptions. She was also started on Singulair along with tramadol and Delsym for cough. She was given a prednisone taper. Overall she feels much improved. Her cough is approximately 75% better. She does continue to get some coughing fits. Cough is mainly dry in nature. She denies any hemoptysis, orthopnea, PND, or leg swelling. Chest x-ray last week showed no acute process    ROS :  Constitutional:   No  weight loss, night sweats,  Fevers, chills,  +fatigue, or  lassitude.  HEENT:   No headaches,  Difficulty swallowing,  Tooth/dental problems, or  Sore throat,                No sneezing, itching, ear ache, nasal congestion, post nasal drip,   CV:  No chest pain,  Orthopnea, PND, swelling in lower extremities, anasarca, dizziness, palpitations, syncope.   GI  No  heartburn, indigestion, abdominal pain, nausea, vomiting, diarrhea, change in bowel habits, loss of appetite, bloody stools.   Resp:   No coughing up of blood.  No change in color of mucus.  No wheezing.  No chest wall deformity  Skin: no rash or lesions.  GU: no dysuria, change in color of urine, no urgency or frequency.  No flank pain, no hematuria   MS:  No joint pain or swelling.  No decreased range of motion.  No back pain.  Psych:  No change in mood or affect. No depression or anxiety.  No memory loss.             Objective:   Physical Exam  Edentulous bf nad   Wt 126  09/15/11 > 12/25/2011  128 > 07/10/2012  139>142 07/16/2012   HEENT: nl dentition, turbinates, and orophanx. Nl external ear canals without cough reflex   NECK :  without JVD/Nodes/TM/ nl carotid upstrokes bilaterally   LUNGS: no acc muscle use, clear to A and P bilaterally     CV:  RRR  no s3 or murmur or increase in P2, no edema   ABD:  soft and nontender with nl excursion in the supine position. No bruits or organomegaly, bowel sounds nl  MS:  warm without deformities, calf tenderness, cyanosis or clubbing        CXR  07/10/2012 :  No active cardiopulmonary disease.           Assessment & Plan:

## 2012-07-16 NOTE — Patient Instructions (Addendum)
Stop Metoprolol  Continue on Bisoprolol daily  Follow med calendar closely and bring to each visit.  follow up Dr. Sherene Sires  In 4 weeks and As needed

## 2012-07-16 NOTE — Assessment & Plan Note (Signed)
Stop metoprolol  Cont on bisoprolol  follow up 4 weeks

## 2012-07-16 NOTE — Assessment & Plan Note (Signed)
Upper airway cough syndrome suspected multifactorial, secondary to reflux, and drainage Patient is improved after a steroid taper. She is advised to continue on cough suppression regimen with Delsym and tramadol. Medication calendar was completed stay with patient education. She is to continue on her current regimen and followup in 4 weeks and as needed

## 2012-08-14 ENCOUNTER — Encounter: Payer: Self-pay | Admitting: Internal Medicine

## 2012-08-14 ENCOUNTER — Ambulatory Visit (INDEPENDENT_AMBULATORY_CARE_PROVIDER_SITE_OTHER): Payer: Medicare Other | Admitting: Internal Medicine

## 2012-08-14 VITALS — BP 140/90 | HR 72 | Temp 98.4°F | Ht 61.0 in | Wt 143.6 lb

## 2012-08-14 DIAGNOSIS — R059 Cough, unspecified: Secondary | ICD-10-CM

## 2012-08-14 DIAGNOSIS — I10 Essential (primary) hypertension: Secondary | ICD-10-CM

## 2012-08-14 DIAGNOSIS — R05 Cough: Secondary | ICD-10-CM

## 2012-08-14 MED ORDER — TRAMADOL HCL 50 MG PO TABS: 50.0000 mg | ORAL_TABLET | ORAL | Status: DC | PRN

## 2012-08-14 MED ORDER — PREDNISONE (PAK) 10 MG PO TABS: ORAL_TABLET | ORAL | Status: DC

## 2012-08-14 MED ORDER — FAMOTIDINE 20 MG PO TABS: ORAL_TABLET | ORAL | Status: DC

## 2012-08-14 MED ORDER — OMEPRAZOLE MAGNESIUM 20 MG PO TBEC: 20.0000 mg | DELAYED_RELEASE_TABLET | Freq: Two times a day (BID) | ORAL | Status: DC

## 2012-08-14 NOTE — Patient Instructions (Addendum)
Stop singulair and losartan  Double your hydrodiuril to 25 mg total daily  Prednisone 10 mg take  4 each am x 2 days,   2 each am x 2 days,  1 each am x2days and stop   Take delsym two tsp every 12 hours and supplement if needed with  tramadol 50 mg up to 2 every 4 hours to suppress the urge to cough. Swallowing water or using ice chips/non mint and menthol containing candies (such as lifesavers or sugarless jolly ranchers) are also effective.  You should rest your voice and avoid activities that you know make you cough.  Once you have eliminated the cough for 3 straight days try reducing the tramadol first,  then the delsym as tolerated.    For drainage, take chlortrimeton 4 mg every 6 hours as needed to see if it helps  GERD (REFLUX)  is an extremely common cause of respiratory symptoms, many times with no significant heartburn at all.    It can be treated with medication, but also with lifestyle changes including avoidance of late meals, excessive alcohol, smoking cessation, and avoid fatty foods, chocolate, peppermint, colas, red wine, and acidic juices such as orange juice.  NO MINT OR MENTHOL PRODUCTS SO NO COUGH DROPS  USE SUGARLESS CANDY INSTEAD (jolley ranchers or Stover's)  NO OIL BASED VITAMINS - use powdered substitutes.   Please schedule a follow up office visit in 2  weeks, sooner if needed

## 2012-08-14 NOTE — Progress Notes (Signed)
Subjective:    Patient ID: Teresa Benson, female    DOB: 06/30/1951    MRN: [id number] HPI 43 yobf never smoker with pattern of recurrent bronchitis esp in winter with one severe enough to crack ribs around 2000 but in between no cough or sob or need for any inhalers self-referred 09/15/2011 for recurrent cough since mid October 2012   09/15/2011 1st pulmonary office eval/ Teresa Benson on coreg  cc acute mostly dry onset cough with sensation of post pnds  mid Oct with nl cxr mid nov  at [facility] pak  then LUQ pain x 2 days Prior to day of OV  On  prilosec q day before bfast with intermittent overt hb but no active sinus complaints and no h/o childhood allergy or asthma.  NO BETTER on day of ov.   Cough is present 24 h per day s much variation at all - no  obvious fluctuation of symptoms with weather or environmental changes or other aggravating or alleviating factors except as outlined above. rec Prilosec 20 mg Take x 2  30-60 min before first meal of the day  And Pepcid 20 mg one at bedtime GERD   Stop corevidol Bystolic 10 mg one twice daily Take mucinex dm two every 12 hours and supplement if needed with  tramadol 50 mg up to 2 every 4 hours  Once you have eliminated the cough for 3 straight days try reducing the tramadol first,  then the delsym as tolerated.   Prednisone 10 mg take  4 each am x 2 days,   2 each am x 2 days,  1 each am x2days and stop  11/2011 admitted [name] x one week no dx but ended up off bystolic and on lopressor 100 bid due to tachycardia apparently   12/25/2011 f/u ov/Teresa Benson back on high dose lopressor twice daily  cc cough back for 2 weeks mostly dry, has trouble in the allergy season with lots of itching sneezing but not typically coughing. Using albuterol sev times a week was told to consider neb albuterol ? By same doctor who rec high dose lopressor? Not sure. [place].  No sob unless coughing rec Singulair 10 mg one daily  until you return Stay on prilosec x 2 x 30 min before first meal and pepcid 20 mg at bedtime Prednisone 10 mg take  4 each am x 2 days,   2 each am x 2 days,  1 each am x2days and stop  If start bad cough, first start using the flutter valve Take delsym two tsp every 12 hours and supplement if needed with  tramadol 50 mg up to 2 every 4 hours F/u 4 weeks > did not return   07/10/2012 f/u ov/Teresa Benson not on singulair/ did stay  on prilosec 20mg  bid ac and pepcid one at bedtime does not know dose but continued to have severe cough "every few months". Present Cough x 7 days typical  honking dry sound, not using flutter, no better with proventil which ran out 9/29 >>changed lopressor to bisoprolol , added singulair, tramadol/delsym for cough , pred taper off  07/16/2012 Follow up and med calendar  Patient returns for a one-week followup for cough. She is brought all of her medications for a medication review. We reviewed all her medications and organized them into a medication calendar with patient education. Last visit. Patient was changed from Lopressor to bisoprolol. However, patient misunderstood instructions and took both  prescriptions. She was also started on Singulair along with tramadol and Delsym for cough. She was given a prednisone taper. Overall she feels much improved. Her cough is approximately 75% better. She does continue to get some coughing fits. Cough  mainly dry in nature. rec Stop Metoprolol  Continue on Bisoprolol daily  Follow med calendar closely and bring to each visit.   08/14/2012 f/u ov/Teresa Benson using calendar well cc cough back the same, and really only better while on tramadol and even then still had urge to clear throat but no excess mucus or sob.  Sleeping ok without nocturnal  or early am exacerbation  of respiratory  c/o's or need for noct saba. Also denies any obvious fluctuation of symptoms with weather or environmental changes or other aggravating or alleviating  factors except as outlined above   ROS  The following are not active complaints unless bolded sore throat, dysphagia, dental problems, itching, sneezing,  nasal congestion or excess/ purulent secretions, ear ache,   fever, chills, sweats, unintended wt loss, pleuritic or exertional cp, hemoptysis,  orthopnea pnd or leg swelling, presyncope, palpitations, heartburn, abdominal pain, anorexia, nausea, vomiting, diarrhea  or change in bowel or urinary habits, change in stools or urine, dysuria,hematuria,  rash, arthralgias, visual complaints, headache, numbness weakness or ataxia or problems with walking or coordination,  change in mood/affect or memory.                       Objective:   Physical Exam  Edentulous bf nad   Wt 126  09/15/11 > 12/25/2011  128 > 07/10/2012  139>142 07/16/2012 > 08/14/2012 143  HEENT: nl dentition, turbinates, and orophanx. Nl external ear canals without cough reflex   NECK :  without JVD/Nodes/TM/ nl carotid upstrokes bilaterally   LUNGS: no acc muscle use, clear to A and P bilaterally     CV:  RRR  no s3 or murmur or increase in P2, no edema   ABD:  soft and nontender with nl excursion in the supine position. No bruits or organomegaly, bowel sounds nl  MS:  warm without deformities, calf tenderness, cyanosis or clubbing        CXR  07/10/2012 :  No active cardiopulmonary disease.           Assessment & Plan:

## 2012-08-15 NOTE — Assessment & Plan Note (Signed)
PFT's wnl 12/25/2011    - Trial of singulair maint rx 12/25/2011  > did not maintain, restart effective 07/11/2012 > no better 08/14/12 so d/c'd   - med calendar 07/16/2012   This is most c/w  Classic Upper airway cough syndrome, so named because it's frequently impossible to sort out how much is  CR/sinusitis with freq throat clearing (which can be related to primary GERD)   vs  causing  secondary (" extra esophageal")  GERD from wide swings in gastric pressure that occur with throat clearing, often  promoting self use of mint and menthol lozenges that reduce the lower esophageal sphincter tone and exacerbate the problem further in a cyclical fashion.   These are the same pts (now being labeled as having "irritable larynx syndrome" by some cough centers) who not infrequently have a history of having failed to tolerate ace inhibitors,  dry powder inhalers or biphosphonates or report having atypical reflux symptoms that don't respond to standard doses of PPI , and are easily confused as having aecopd or asthma flares by even experienced allergists/ pulmonologists.  For now continue  Max gerd rx and add 1st gen h1 per guidleines.

## 2012-08-15 NOTE — Assessment & Plan Note (Signed)
Changed bystolic to lopressor in high doses for tachycardia 11/2011 - Recurrent severe cough so changed from lopressor 100 bid to bisoprolol 10 mg bid 07/11/2012  - Trial off losartan started 08/15/2012  Losartan has been anectdotally assoc with cough so try off but  Recheck in 2 weeks

## 2012-08-29 ENCOUNTER — Encounter: Payer: Self-pay | Admitting: Internal Medicine

## 2012-08-29 ENCOUNTER — Ambulatory Visit (INDEPENDENT_AMBULATORY_CARE_PROVIDER_SITE_OTHER): Payer: Medicare Other | Admitting: Internal Medicine

## 2012-08-29 VITALS — BP 104/60 | HR 75 | Temp 98.2°F | Ht 61.0 in | Wt 143.0 lb

## 2012-08-29 DIAGNOSIS — R05 Cough: Secondary | ICD-10-CM

## 2012-08-29 DIAGNOSIS — I10 Essential (primary) hypertension: Secondary | ICD-10-CM

## 2012-08-29 DIAGNOSIS — R059 Cough, unspecified: Secondary | ICD-10-CM

## 2012-08-29 NOTE — Progress Notes (Signed)
Subjective:    Patient ID: Teresa Benson, female    DOB: [birth year]    MRN: [id number]    HPI 33 yobf never smoker with pattern of recurrent bronchitis esp in winter with one severe enough to crack ribs around 2000 but in between no cough or sob or need for any inhalers self-referred 09/15/2011 for recurrent cough since mid October 2012   09/15/2011 1st pulmonary office eval/ Teresa Benson on coreg  cc acute mostly dry onset cough with sensation of post pnds  mid Oct with nl cxr mid nov  at [facility] rx z pak  then LUQ pain x 2 days Prior to day of OV  On  prilosec q day before bfast with intermittent overt hb but no active sinus complaints and no h/o childhood allergy or asthma.  NO BETTER on day of ov.   Cough is present 24 h per day s much variation at all - no  obvious fluctuation of symptoms with weather or environmental changes or other aggravating or alleviating factors except as outlined above. rec Prilosec 20 mg Take x 2  30-60 min before first meal of the day  And Pepcid 20 mg one at bedtime GERD   Stop corevidol Bystolic 10 mg one twice daily Take mucinex dm two every 12 hours and supplement if needed with  tramadol 50 mg up to 2 every 4 hours  Once you have eliminated the cough for 3 straight days try reducing the tramadol first,  then the delsym as tolerated.   Prednisone 10 mg take  4 each am x 2 days,   2 each am x 2 days,  1 each am x2days and stop  11/2011 admitted [name] x one week no dx but ended up off bystolic and on lopressor 100 bid due to tachycardia apparently   12/25/2011 f/u ov/Teresa Benson back on high dose lopressor twice daily  cc cough back for 2 weeks mostly dry, has trouble in the allergy season with lots of itching sneezing but not typically coughing. Using albuterol sev times a week was told to consider neb albuterol ? By same doctor who rec high dose lopressor? Not sure. [place].  No sob unless coughing rec Singulair 10 mg one  daily until you return Stay on prilosec x 2 x 30 min before first meal and pepcid 20 mg at bedtime Prednisone 10 mg take  4 each am x 2 days,   2 each am x 2 days,  1 each am x2days and stop  If start bad cough, first start using the flutter valve Take delsym two tsp every 12 hours and supplement if needed with  tramadol 50 mg up to 2 every 4 hours F/u 4 weeks > did not return   07/10/2012 f/u ov/Teresa Benson not on singulair/ did stay  on prilosec 20mg  bid ac and pepcid one at bedtime does not know dose but continued to have severe cough "every few months". Present Cough x 7 days typical  honking dry sound, not using flutter, no better with proventil which ran out 9/29 >>changed lopressor to bisoprolol , added singulair, tramadol/delsym for cough , pred taper off  07/16/2012 Follow up and med calendar  Patient returns for a one-week followup for cough. She is brought all of her medications for a medication review. We reviewed all her medications and organized them into a medication calendar with patient education. Last visit. Patient was changed from Lopressor to bisoprolol. However, patient misunderstood instructions  and took both prescriptions. She was also started on Singulair along with tramadol and Delsym for cough. She was given a prednisone taper. Overall she feels much improved. Her cough is approximately 75% better. She does continue to get some coughing fits. Cough  mainly dry in nature. rec Stop Metoprolol  Continue on Bisoprolol daily  Follow med calendar closely and bring to each visit.   08/14/2012 f/u ov/Teresa Benson using calendar well cc cough back the same, and really only better while on tramadol and even then still had urge to clear throat but no excess mucus or sob. rec Stop singulair and losartan Double your hydrodiuril to 25 mg total daily Prednisone 10 mg take  4 each am x 2 days,   2 each am x 2 days,  1 each am x2days and stop  Take delsym two tsp every 12 hours and supplement if  needed with  tramadol 50 mg up to 2 every 4 hours to suppress the urge to cough.  For drainage, take chlortrimeton 4 mg every 6 hours as needed to see if it helps GERD diet   08/29/2012 f/u ov/Teresa Benson  using calendar but didn't bring it, no longer needs cough meds or h1 at all with no sign cough or sob.     Sleeping ok without nocturnal  or early am exacerbation  of respiratory  c/o's or need for noct saba. Also denies any obvious fluctuation of symptoms with weather or environmental changes or other aggravating or alleviating factors except as outlined above   ROS  The following are not active complaints unless bolded sore throat, dysphagia, dental problems, itching, sneezing,  nasal congestion or excess/ purulent secretions, ear ache,   fever, chills, sweats, unintended wt loss, pleuritic or exertional cp, hemoptysis,  orthopnea pnd or leg swelling, presyncope, palpitations, heartburn, abdominal pain, anorexia, nausea, vomiting, diarrhea  or change in bowel or urinary habits, change in stools or urine, dysuria,hematuria,  rash, arthralgias, visual complaints, headache, numbness weakness or ataxia or problems with walking or coordination,  change in mood/affect or memory.                       Objective:   Physical Exam  Edentulous bf nad   Wt 126  09/15/11 > 12/25/2011  128 > 07/10/2012  139>142 07/16/2012 > 08/14/2012 143> 08/29/2012  143  HEENT: nl dentition, turbinates, and orophanx. Nl external ear canals without cough reflex   NECK :  without JVD/Nodes/TM/ nl carotid upstrokes bilaterally   LUNGS: no acc muscle use, clear to A and P bilaterally     CV:  RRR  no s3 or murmur or increase in P2, no edema   ABD:  soft and nontender with nl excursion in the supine position. No bruits or organomegaly, bowel sounds nl  MS:  warm without deformities, calf tenderness, cyanosis or clubbing        CXR  07/10/2012 :  No active cardiopulmonary disease.             Assessment & Plan:

## 2012-08-29 NOTE — Patient Instructions (Addendum)

## 2012-08-29 NOTE — Assessment & Plan Note (Signed)
-   PFT's wnl 12/25/2011    - Trial of singulair maint rx 12/25/2011  > did not maintain, restart effective 07/11/2012 > no better 08/14/12 so d/c'd  On max gerd rx only needed a few days of H1 and can control the cough s singulair or steroids or inhalers typical of  Classic Upper airway cough syndrome, so named because it's frequently impossible to sort out how much is  CR/sinusitis with freq throat clearing (which can be related to primary GERD)   vs  causing  secondary (" extra esophageal")  GERD from wide swings in gastric pressure that occur with throat clearing, often  promoting self use of mint and menthol lozenges that reduce the lower esophageal sphincter tone and exacerbate the problem further in a cyclical fashion.   These are the same pts (now being labeled as having "irritable larynx syndrome" by some cough centers) who not infrequently have a history of having failed to tolerate ace inhibitors,  dry powder inhalers or biphosphonates or report having atypical reflux symptoms that don't respond to standard doses of PPI , and are easily confused as having aecopd or asthma flares by even experienced allergists/ pulmonologists.     Each maintenance medication was reviewed in detail including most importantly the difference between maintenance and as needed and under what circumstances the prns are to be used.  Please see instructions for details which were reviewed in writing and the patient given a copy.

## 2012-08-29 NOTE — Assessment & Plan Note (Signed)
Changed bystolic to lopressor in high doses for tachycardia 11/2011 - Recurrent severe cough so changed from lopressor 100 bid to bisoprolol 10 mg bid 07/11/2012  - Trial off losartan started 08/15/2012   Adequate control on present rx, reviewed

## 2012-10-03 ENCOUNTER — Telehealth: Payer: Self-pay | Admitting: Internal Medicine

## 2012-10-03 MED ORDER — PREDNISONE 10 MG PO TABS: ORAL_TABLET | ORAL | Status: DC

## 2012-10-03 MED ORDER — TRAMADOL HCL 50 MG PO TABS: 50.0000 mg | ORAL_TABLET | ORAL | Status: DC | PRN

## 2012-10-03 NOTE — Telephone Encounter (Signed)
Prednisone 10 mg take  4 each am x 2 days,   2 each am x 2 days,  1 each am x2days and stop Tramadol 50 # 30 no refills  1-2 q 4 h prn severe cough

## 2012-10-03 NOTE — Telephone Encounter (Signed)
Spoke with pt She states cough started back 2 days ago- non prod She states "it's deep in my chest" She is also having nasal congestion, and unable to breathe through her nose at all therefore having increased SOB She is taking delsym, but is out of tramadol She is asking for a pred taper Unable to come in b[place]c [place] Please advise, thanks! No Known Allergies

## 2012-10-03 NOTE — Telephone Encounter (Signed)
Called spoke with patient, advised of MW's recs as stated below.  Pt okay with these recs and verbalized her understanding.  Rx's telephoned to the verified pharmacy phone number to pharmacist [facility].  MAR updated.

## 2012-10-16 ENCOUNTER — Other Ambulatory Visit: Payer: Self-pay | Admitting: *Deleted

## 2012-10-16 DIAGNOSIS — G4700 Insomnia, unspecified: Secondary | ICD-10-CM

## 2012-10-16 MED ORDER — ZOLPIDEM TARTRATE ER 6.25 MG PO TBCR: 6.2500 mg | EXTENDED_RELEASE_TABLET | Freq: Every day | ORAL | Status: DC

## 2012-11-20 ENCOUNTER — Ambulatory Visit (INDEPENDENT_AMBULATORY_CARE_PROVIDER_SITE_OTHER): Payer: Medicare Other | Admitting: Internal Medicine

## 2012-11-20 ENCOUNTER — Encounter: Payer: Self-pay | Admitting: Internal Medicine

## 2012-11-20 ENCOUNTER — Ambulatory Visit (INDEPENDENT_AMBULATORY_CARE_PROVIDER_SITE_OTHER)
Admission: RE | Admit: 2012-11-20 | Discharge: 2012-11-20 | Disposition: A | Discharge status: Home / Self Care | Payer: Medicare Other | Source: Ambulatory Visit | Attending: Internal Medicine | Admitting: Internal Medicine

## 2012-11-20 VITALS — BP 120/80 | HR 86 | Temp 98.0°F | Ht 63.0 in | Wt 145.0 lb

## 2012-11-20 DIAGNOSIS — R05 Cough: Secondary | ICD-10-CM

## 2012-11-20 DIAGNOSIS — R059 Cough, unspecified: Secondary | ICD-10-CM

## 2012-11-20 DIAGNOSIS — I10 Essential (primary) hypertension: Secondary | ICD-10-CM

## 2012-11-20 MED ORDER — PREDNISONE 10 MG PO TABS: ORAL_TABLET | ORAL | Status: DC

## 2012-11-20 MED ORDER — TRAMADOL HCL 50 MG PO TABS: 50.0000 mg | ORAL_TABLET | ORAL | Status: DC | PRN

## 2012-11-20 NOTE — Patient Instructions (Addendum)
Prednisone 10 mg take  4 each am x 2 days,   2 each am x 2 days,  1 each am x2days and stop   Take delsym two tsp every 12 hours and supplement if needed with  tramadol 50 mg up to 2 every 4 hours to suppress the urge to cough. Swallowing water or using ice chips/non mint and menthol containing candies (such as lifesavers or sugarless jolly ranchers) are also effective.  You should rest your voice and avoid activities that you know make you cough.  Once you have eliminated the cough for 3 straight days try reducing the tramadol first,  then the delsym as tolerated.    For drainage, take chlortrimeton 4 mg every 6 hours as needed to see if it helps  GERD (REFLUX)  is an extremely common cause of respiratory symptoms, many times with no significant heartburn at all.    It can be treated with medication, but also with lifestyle changes including avoidance of late meals, excessive alcohol, smoking cessation, and avoid fatty foods, chocolate, peppermint, colas, red wine, and acidic juices such as orange juice.  NO MINT OR MENTHOL PRODUCTS SO NO COUGH DROPS  USE SUGARLESS CANDY INSTEAD (jolley ranchers or Stover's)  NO OIL BASED VITAMINS - use powdered substitutes.  Please see patient coordinator before you leave today  to schedule sinus ct  Please remember to go to the lab and x-ray department downstairs for your tests - we will call you with the results when they are available.      See Tammy NP w/in 2 weeks with all your medications, even over the counter meds, separated in two separate bags, the ones you take no matter what vs the ones you stop once you feel better and take only as needed when you feel you need them.   Tammy  will generate for you a new user friendly medication calendar that will put [place] all on the same page re: your medication use.     Without this process, it simply isn't possible to assure that we are providing  your outpatient care  with  the attention to detail we feel you  deserve.   If we cannot assure that you're getting that kind of care,  then we cannot manage your problem effectively from this clinic.  Once you have seen Tammy and we are sure that we're all on the same page with your medication use she will arrange follow up with me.   Late add Sinus ct c/w CR/ sinusitis so if allergy profile clearly pos refer to [facility],  If not, refer to ENT next step

## 2012-11-20 NOTE — Progress Notes (Signed)
Subjective:    Patient ID: Teresa Benson, female    DOB: 09-06-1951    MRN: [id number]    HPI 22 yobf never smoker with pattern of recurrent bronchitis esp in winter with one severe enough to crack ribs around 2000 but in between no cough or sob or need for any inhalers self-referred 09/15/2011 for recurrent cough since mid October 2012   09/15/2011 1st pulmonary office eval/ Wert on coreg  cc acute mostly dry onset cough with sensation of post pnds  mid Oct with nl cxr mid nov  [facility]ak  then LUQ pain x 2 days Prior to day of OV  On  prilosec q day before bfast with intermittent overt hb but no active sinus complaints and no h/o childhood allergy or asthma.  NO BETTER on day of ov.  rec Prilosec 20 mg Take x 2  30-60 min before first meal of the day  And Pepcid 20 mg one at bedtime GERD   Stop corevidol Bystolic 10 mg one twice daily Take mucinex dm two every 12 hours and supplement if needed with  tramadol 50 mg up to 2 every 4 hours  Once you have eliminated the cough for 3 straight days try reducing the tramadol first,  then the delsym as tolerated.   Prednisone 10 mg take  4 each am x 2 days,   2 each am x 2 days,  1 each am x2days and stop  11/2011 admitted [name] x one week no dx but ended up off bystolic and on lopressor 100 bid due to tachycardia apparently   12/25/2011 f/u ov/Wert back on high dose lopressor twice daily  cc cough back for 2 weeks mostly dry, has trouble in the allergy season with lots of itching sneezing but not typically coughing. Using albuterol sev times a week was told to consider neb albuterol ? By same doctor who rec high dose lopressor? Not sure. [place].  No sob unless coughing rec Singulair 10 mg one daily until you return Stay on prilosec x 2 x 30 min before first meal and pepcid 20 mg at bedtime Prednisone 10 mg take  4 each am x 2 days,   2 each am x 2 days,  1 each am x2days and stop  If start  bad cough, first start using the flutter valve Take delsym two tsp every 12 hours and supplement if needed with  tramadol 50 mg up to 2 every 4 hours F/u 4 weeks > did not return   07/10/2012 f/u ov/Wert not on singulair/ did stay  on prilosec 20mg  bid ac and pepcid one at bedtime does not know dose but continued to have severe cough "every few months". Present Cough x 7 days typical  honking dry sound, not using flutter, no better with proventil which ran out 9/29 >>changed lopressor to bisoprolol , added singulair, tramadol/delsym for cough , pred taper off  07/16/2012 Follow up and med calendar  Patient returns for a one-week followup for cough. She is brought all of her medications for a medication review. We reviewed all her medications and organized them into a medication calendar with patient education. Last visit. Patient was changed from Lopressor to bisoprolol. However, patient misunderstood instructions and took both prescriptions. She was also started on Singulair along with tramadol and Delsym for cough. She was given a prednisone taper. Overall she feels much improved. Her cough is approximately 75% better. She  does continue to get some coughing fits. Cough  mainly dry in nature. rec Stop Metoprolol  Continue on Bisoprolol daily  Follow med calendar closely and bring to each visit.   08/14/2012 f/u ov/Wert using calendar well cc cough back the same, and really only better while on tramadol and even then still had urge to clear throat but no excess mucus or sob. rec Stop singulair and losartan Double your hydrodiuril to 25 mg total daily Prednisone 10 mg take  4 each am x 2 days,   2 each am x 2 days,  1 each am x2days and stop  Take delsym two tsp every 12 hours and supplement if needed with  tramadol 50 mg up to 2 every 4 hours to suppress the urge to cough.  For drainage, take chlortrimeton 4 mg every 6 hours as needed to see if it helps GERD diet   08/29/2012 f/u ov/Wert   using calendar but didn't bring it, no longer needs cough meds or h1 at all with no sign cough or sob.   rec Follow calendar  11/20/2012 acute w/u in ov /Wert did not bring calendar  But "use it every day" cc worse cough and sob x 10 days, nonproductive , coughs to point of loosing her breath. gen ant chest discomfort brought on my coughing.  Cough more day than night.  No obvious daytime variabilty or assoc   subjective wheeze overt sinus or hb symptoms. No unusual exp hx or h/o childhood pna/ asthma or premature birth to her knowledge.    At baseline eeping ok without nocturnal  or early am exacerbation  of respiratory  c/o's or need for noct saba. Also denies any obvious fluctuation of symptoms with weather or environmental changes or other aggravating or alleviating factors except as outlined above   ROS  The following are not active complaints unless bolded sore throat, dysphagia, dental problems, itching, sneezing,  nasal congestion or excess/ purulent secretions, ear ache,   fever, chills, sweats, unintended wt loss, pleuritic or exertional cp, hemoptysis,  orthopnea pnd or leg swelling, presyncope, palpitations, heartburn, abdominal pain, anorexia, nausea, vomiting, diarrhea  or change in bowel or urinary habits, change in stools or urine, dysuria,hematuria,  rash, arthralgias, visual complaints, headache, numbness weakness or ataxia or problems with walking or coordination,  change in mood/affect or memory.                       Objective:   Physical Exam  Edentulous bf nad nasal tone to voice  Wt 126  09/15/11 > 12/25/2011  128 > 07/10/2012  139>142 07/16/2012 > 08/14/2012 143> 08/29/2012  143> 11/20/2012  145   HEENT: nl dentition, turbinates, and orophanx. Nl external ear canals without cough reflex   NECK :  without JVD/Nodes/TM/ nl carotid upstrokes bilaterally   LUNGS: no acc muscle use, clear to A and P bilaterally     CV:  RRR  no s3 or murmur or increase in P2,  no edema   ABD:  soft and nontender with nl excursion in the supine position. No bruits or organomegaly, bowel sounds nl  MS:  warm without deformities, calf tenderness, cyanosis or clubbing        CXR  07/10/2012 :  No active cardiopulmonary disease.  Sinus ct 11/20/2012  Complete opacification visualized aspect of the left frontal sinus. Mild mucosal thickening right frontal sinus.  Moderate mucosal thickening / opacification ethmoid sinus air cells bilaterally. No evidence  of intraorbital extension.  Opacification/mucosal thickening maxillary sinuses greater on the right.  Mild mucosal thickening sphenoid sinus air cells.  Limited evaluation infundibulum.  Portions of the sinus opacification with central hyperdense material which may represent inspissated proteinaceous material.             Assessment & Plan:

## 2012-11-20 NOTE — Assessment & Plan Note (Addendum)
-   PFT's wnl 12/25/2011    - Trial of singulair maint rx 12/25/2011  > did not maintain, restart effective 07/11/2012 > no better 08/14/12 so d/c'd   - med calendar 07/16/2012 >    - Sinus CT 11/20/2012 > Complete opacification visualized aspect of the left frontal sinus. Mild mucosal thickening right frontal sinus.Moderate mucosal thickening / opacification ethmoid sinus air cells bilaterally. No evidence of intraorbital extension.Opacification/mucosal thickening maxillary sinuses greater on the right.Mild mucosal thickening sphenoid sinus air cells.Limited evaluation infundibulum.Portions of the sinus opacification with central hyperdense material which may represent inspissated proteinaceous material. 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   These are the same pts (now being labeled as having "irritable larynx syndrome" by some cough centers) who not infrequently have a history of having failed to tolerate ace inhibitors,  dry powder inhalers or biphosphonates or report having atypical reflux symptoms that don't respond to standard doses of PPI , and are easily confused as having aecopd or asthma flares by even experienced allergists/ pulmonologists.  rec max rx for cyclical cough then regroup for new med calendar and consider ent vs allergy eval depending on results of profile   For now max rx for gerd and pnds and consider ent eval for chronic sinusitis  See instructions for specific recommendations which were reviewed directly  with the patient who was given a copy with highlighter outlining the key components.

## 2012-11-20 NOTE — Assessment & Plan Note (Signed)
Adequate control on present rx, reviewed  

## 2012-11-25 NOTE — Progress Notes (Signed)
Quick Note:  Spoke with pt and notified of results per Dr. Wert. Pt verbalized understanding and denied any questions.  ______ 

## 2012-11-27 ENCOUNTER — Ambulatory Visit: Payer: Medicare Other | Admitting: Internal Medicine

## 2012-11-28 ENCOUNTER — Ambulatory Visit: Payer: Medicare Other | Admitting: Internal Medicine

## 2012-12-04 ENCOUNTER — Encounter: Payer: Medicare Other | Admitting: Adult Health

## 2012-12-12 ENCOUNTER — Encounter: Payer: Self-pay | Admitting: Adult Health

## 2012-12-12 ENCOUNTER — Ambulatory Visit (INDEPENDENT_AMBULATORY_CARE_PROVIDER_SITE_OTHER): Payer: Medicare Other | Admitting: Adult Health

## 2012-12-12 VITALS — BP 104/66 | HR 71 | Temp 98.8°F | Ht 61.0 in | Wt 146.2 lb

## 2012-12-12 DIAGNOSIS — R059 Cough, unspecified: Secondary | ICD-10-CM

## 2012-12-12 DIAGNOSIS — R05 Cough: Secondary | ICD-10-CM

## 2012-12-12 NOTE — Assessment & Plan Note (Signed)
Persistent cough despite aggressive prevention of GERD/PND and cough control regimen along with steroid tapers  Check allergy profile today w/ CBC  CTsinus showed sigificant chronic sinusutis   Patient's medications were reviewed today and patient education was given. Computerized medication calendar was adjusted/completed  Plan  Refer to ENT  Add saline nasal rinses.  Labs pending.

## 2012-12-12 NOTE — Progress Notes (Signed)
Subjective:    Patient ID: Teresa Benson, female    DOB: 08-25-1951    MRN: [id number]  HPI 1 yobf never smoker with pattern of recurrent bronchitis esp in winter with one severe enough to crack ribs around 2000 but in between no cough or sob or need for any inhalers self-referred 09/15/2011 for recurrent cough since mid October 2012   09/15/2011 1st pulmonary office eval/ Wert on coreg  cc acute mostly dry onset cough with sensation of post pnds  mid Oct with nl cxr mid nov  at [facility] z pak  then LUQ pain x 2 days Prior to day of OV  On  prilosec q day before bfast with intermittent overt hb but no active sinus complaints and no h/o childhood allergy or asthma.  NO BETTER on day of ov.  rec Prilosec 20 mg Take x 2  30-60 min before first meal of the day  And Pepcid 20 mg one at bedtime GERD   Stop corevidol Bystolic 10 mg one twice daily Take mucinex dm two every 12 hours and supplement if needed with  tramadol 50 mg up to 2 every 4 hours  Once you have eliminated the cough for 3 straight days try reducing the tramadol first,  then the delsym as tolerated.   Prednisone 10 mg take  4 each am x 2 days,   2 each am x 2 days,  1 each am x2days and stop  11/2011 admitted [name] x one week no dx but ended up off bystolic and on lopressor 100 bid due to tachycardia apparently   12/25/2011 f/u ov/Wert back on high dose lopressor twice daily  cc cough back for 2 weeks mostly dry, has trouble in the allergy season with lots of itching sneezing but not typically coughing. Using albuterol sev times a week was told to consider neb albuterol ? By same doctor who rec high dose lopressor? Not sure. [place].  No sob unless coughing rec Singulair 10 mg one daily until you return Stay on prilosec x 2 x 30 min before first meal and pepcid 20 mg at bedtime Prednisone 10 mg take  4 each am x 2 days,   2 each am x 2 days,  1 each am x2days and stop  If start bad  cough, first start using the flutter valve Take delsym two tsp every 12 hours and supplement if needed with  tramadol 50 mg up to 2 every 4 hours F/u 4 weeks > did not return   07/10/2012 f/u ov/Wert not on singulair/ did stay  on prilosec 20mg  bid ac and pepcid one at bedtime does not know dose but continued to have severe cough "every few months". Present Cough x 7 days typical  honking dry sound, not using flutter, no better with proventil which ran out 9/29 >>changed lopressor to bisoprolol , added singulair, tramadol/delsym for cough , pred taper off  07/16/2012 Follow up and med calendar  Patient returns for a one-week followup for cough. She is brought all of her medications for a medication review. We reviewed all her medications and organized them into a medication calendar with patient education. Last visit. Patient was changed from Lopressor to bisoprolol. However, patient misunderstood instructions and took both prescriptions. She was also started on Singulair along with tramadol and Delsym for cough. She was given a prednisone taper. Overall she feels much improved. Her cough is approximately 75% better. She does continue  to get some coughing fits. Cough  mainly dry in nature. rec Stop Metoprolol  Continue on Bisoprolol daily  Follow med calendar closely and bring to each visit.   08/14/2012 f/u ov/Wert using calendar well cc cough back the same, and really only better while on tramadol and even then still had urge to clear throat but no excess mucus or sob. rec Stop singulair and losartan Double your hydrodiuril to 25 mg total daily Prednisone 10 mg take  4 each am x 2 days,   2 each am x 2 days,  1 each am x2days and stop  Take delsym two tsp every 12 hours and supplement if needed with  tramadol 50 mg up to 2 every 4 hours to suppress the urge to cough.  For drainage, take chlortrimeton 4 mg every 6 hours as needed to see if it helps GERD diet   08/29/2012 f/u ov/Wert   using calendar but didn't bring it, no longer needs cough meds or h1 at all with no sign cough or sob.   rec Follow calendar  11/20/2012 acute w/u in ov /Wert did not bring calendar  But "use it every day" cc worse cough and sob x 10 days, nonproductive , coughs to point of loosing her breath. gen ant chest discomfort brought on my coughing.  Cough more day than night. >>Pred taper , CT sinus >  12/12/2012 Follow up and med review  Returns for 3 week follow up and med review. Continues to have daily cough . tx with steroid taper last ov with some help but cough never went away and as soon as steroids done cough worsens each day.  Taking delsym and tramadol for cough control w/ some help.  We reviewed all her meds and organized them into a med calendar w/ pt education . Appears she is taking correctly.   Underwent CT sinus on 2/17 that showed  Complete opacification visualized aspect of the left  frontal sinus.  Moderate mucosal thickening / opacification ethmoid sinus air cells bilaterally. Opacification/mucosal thickening maxillary sinuses greater on the right.     ROS:  Constitutional:   No  weight loss, night sweats,  Fevers, chills,  +fatigue, or  lassitude.  HEENT:   No headaches,  Difficulty swallowing,  Tooth/dental problems, or  Sore throat,                No sneezing, itching, ear ache,  +nasal congestion, post nasal drip,   CV:  No chest pain,  Orthopnea, PND, swelling in lower extremities, anasarca, dizziness, palpitations, syncope.   GI  No heartburn, indigestion, abdominal pain, nausea, vomiting, diarrhea, change in bowel habits, loss of appetite, bloody stools.   Resp:  No chest wall deformity  Skin: no rash or lesions.   GU: no dysuria, change in color of urine, no urgency or frequency.  No flank pain, no hematuria   MS:  No joint pain or swelling.  No decreased range of motion.  No back pain.  Psych:  No change in mood or affect. No depression or anxiety.  No memory  loss.             Objective:   Physical Exam  Edentulous bf nad nasal tone to voice  Wt 126  09/15/11 > 12/25/2011  128 > 07/10/2012  139>142 07/16/2012 > 08/14/2012 143> 08/29/2012  143> 11/20/2012  145 >146 12/12/2012   HEENT: nl dentition, turbinates, and orophanx. Nl external ear canals without cough reflex   NECK :  without JVD/Nodes/TM/ nl carotid upstrokes bilaterally   LUNGS: no acc muscle use, clear to A and P bilaterally     CV:  RRR  no s3 or murmur or increase in P2, no edema   ABD:  soft and nontender with nl excursion in the supine position. No bruits or organomegaly, bowel sounds nl  MS:  warm without deformities, calf tenderness, cyanosis or clubbing        CXR  07/10/2012 :  No active cardiopulmonary disease.  Sinus ct 11/20/2012  Complete opacification visualized aspect of the left frontal sinus. Mild mucosal thickening right frontal sinus.  Moderate mucosal thickening / opacification ethmoid sinus air cells bilaterally. No evidence of intraorbital extension.  Opacification/mucosal thickening maxillary sinuses greater on the right.  Mild mucosal thickening sphenoid sinus air cells.  Limited evaluation infundibulum.  Portions of the sinus opacification with central hyperdense material which may represent inspissated proteinaceous material.             Assessment & Plan:

## 2012-12-12 NOTE — Patient Instructions (Addendum)
We are referring you to ENT for sinus issues and cough  Add Saline nasal rinses As needed   I will call with labs results.  Continue on cough regimen  Follow med calendar closely and bring to each visit.  Please contact office for sooner follow up if symptoms do not improve or worsen or seek emergency care  follow up Dr. Sherene Sires  In 2 months and As needed

## 2012-12-16 ENCOUNTER — Other Ambulatory Visit: Payer: Self-pay | Admitting: Internal Medicine

## 2012-12-17 ENCOUNTER — Encounter (HOSPITAL_COMMUNITY): Payer: Self-pay | Admitting: Emergency Medicine

## 2012-12-17 ENCOUNTER — Emergency Department (HOSPITAL_COMMUNITY)
Admission: EM | Admit: 2012-12-17 | Discharge: 2012-12-18 | Disposition: A | Discharge status: Home / Self Care | Payer: Medicare Other | Attending: Emergency Medicine | Admitting: Emergency Medicine

## 2012-12-17 ENCOUNTER — Emergency Department (HOSPITAL_COMMUNITY): Payer: Medicare Other

## 2012-12-17 DIAGNOSIS — Z8739 Personal history of other diseases of the musculoskeletal system and connective tissue: Secondary | ICD-10-CM | POA: Insufficient documentation

## 2012-12-17 DIAGNOSIS — R0789 Other chest pain: Secondary | ICD-10-CM | POA: Insufficient documentation

## 2012-12-17 DIAGNOSIS — R059 Cough, unspecified: Secondary | ICD-10-CM | POA: Insufficient documentation

## 2012-12-17 DIAGNOSIS — Z862 Personal history of diseases of the blood and blood-forming organs and certain disorders involving the immune mechanism: Secondary | ICD-10-CM | POA: Insufficient documentation

## 2012-12-17 DIAGNOSIS — Z8639 Personal history of other endocrine, nutritional and metabolic disease: Secondary | ICD-10-CM | POA: Insufficient documentation

## 2012-12-17 DIAGNOSIS — Z8719 Personal history of other diseases of the digestive system: Secondary | ICD-10-CM | POA: Insufficient documentation

## 2012-12-17 DIAGNOSIS — J45901 Unspecified asthma with (acute) exacerbation: Secondary | ICD-10-CM

## 2012-12-17 DIAGNOSIS — Z8679 Personal history of other diseases of the circulatory system: Secondary | ICD-10-CM | POA: Insufficient documentation

## 2012-12-17 DIAGNOSIS — Z8659 Personal history of other mental and behavioral disorders: Secondary | ICD-10-CM | POA: Insufficient documentation

## 2012-12-17 DIAGNOSIS — Z79899 Other long term (current) drug therapy: Secondary | ICD-10-CM | POA: Insufficient documentation

## 2012-12-17 DIAGNOSIS — I10 Essential (primary) hypertension: Secondary | ICD-10-CM | POA: Insufficient documentation

## 2012-12-17 DIAGNOSIS — J3489 Other specified disorders of nose and nasal sinuses: Secondary | ICD-10-CM | POA: Insufficient documentation

## 2012-12-17 MED ORDER — ALBUTEROL SULFATE (5 MG/ML) 0.5% IN NEBU
5.0000 mg | INHALATION_SOLUTION | Freq: Once | RESPIRATORY_TRACT | Status: AC
  Administered 2012-12-17: 5 mg via RESPIRATORY_TRACT
  Filled 2012-12-17: qty 1

## 2012-12-17 NOTE — ED Notes (Signed)
Per ems- pt called from home for resp distress. Wheezing in all lung fields upon ems arrival- no hx of asthma but does have hx of chronic dry cough she has seen multiple specialists about. ems administers 10mg  albuterol, 0.10mcg of Atrovent 125mg  solu-medrol IV. 20G R thumb. bp- 144/77 pulses 110 sinus tach. 97% with neb tx. Lung sounds have improved with tx. Pt a&ox4, ambulatory, able to speak in full sentences, NAD at this time.

## 2012-12-18 MED ORDER — ALBUTEROL SULFATE HFA 108 (90 BASE) MCG/ACT IN AERS: 2.0000 | INHALATION_SPRAY | Freq: Four times a day (QID) | RESPIRATORY_TRACT | Status: DC | PRN

## 2012-12-18 MED ORDER — PREDNISONE 20 MG PO TABS: 60.0000 mg | ORAL_TABLET | Freq: Every day | ORAL | Status: DC

## 2012-12-18 NOTE — ED Provider Notes (Signed)
History     CSN: [id number]  Arrival date & time 12/17/12  2002   First MD Initiated Contact with Patient 12/17/12 2010      Chief Complaint  Patient presents with  . Shortness of Breath    (Consider location/radiation/quality/duration/timing/severity/associated sxs/prior treatment) HPI Comments: Patient presenting with a chief complaint of dry cough, wheezing, chest tightness, and SOB.  She reports that the dry cough has been present for 2 years.  However, the wheezing, chest tightness, and SOB has been present for the past two days and is gradually worsening.  Patient given 10 mg Albuterol, 0.5 mcg of Atrovent, and 125 mg Solu-Medrol IV by EMS prior to arrival.  She reports that her symptoms have improved after treatment.  Patient has been seen by Pulmonology for her chronic cough and was prescribed Delsym, which she has been taking.  She reports that she has never been formally diagnosed with Asthma or COPD, but has had several episodes similar to this in the past.  She reports that Prednisone and Albuterol typically help the symptoms.  She has never had to be intubated in the past.  She does not smoke and never has smoked.  No prior history of DVT or PE.  No lower extremity edema or erythema.  No use of estrogen containing medications.  No prolonged travel or surgeries in the past 4 weeks.    The history is provided by the patient.    Past Medical History  Diagnosis Date  . Lower back pain   . HTN (hypertension)   . Dyslipidemia   . Mild aortic sclerosis   . Tachycardia   . Normal nuclear stress test July 2010  . H/O: GI bleed   . Depression     Past Surgical History  Procedure Laterality Date  . Tubal ligation    . Abdominal hysterectomy    . Back surgery      Family History  Problem Relation Age of Onset  . Stroke Father   . Heart disease Mother     CABG  . Heart attack Mother   . Asthma Grandchild     History  Substance Use Topics  . Smoking status: Never  Smoker   . Smokeless tobacco: Never Used     Comment: Lived with a smoker for at least 20 years  . Alcohol Use: No    OB History   Grav Para Term Preterm Abortions TAB SAB Ect Mult Living                  Review of Systems  Constitutional: Negative for fever and chills.  HENT: Positive for congestion and rhinorrhea.   Respiratory: Positive for chest tightness, shortness of breath and wheezing.   Cardiovascular: Negative for chest pain, palpitations and leg swelling.  All other systems reviewed and are negative.    Allergies  Review of patient's allergies indicates no known allergies.  Home Medications   Current Outpatient Rx  Name  Route  Sig  Dispense  Refill  . amLODipine (NORVASC) 5 MG tablet   Oral   Take 5 mg by mouth daily.          . bisoprolol (ZEBETA) 10 MG tablet   Oral   Take 1 tablet (10 mg total) by mouth daily.   60 tablet   11   . dextromethorphan (DELSYM) 30 MG/5ML liquid   Oral   Take 60 mg by mouth 2 (two) times daily. 2 tsp every 12 hours as needed         .  famotidine (PEPCID) 20 MG tablet   Oral   Take 20 mg by mouth at bedtime as needed for heartburn. One at bedtime         . hydrochlorothiazide (HYDRODIURIL) 25 MG tablet   Oral   Take 25 mg by mouth daily.         . Multiple Vitamins-Calcium (ONE-A-DAY WOMENS FORMULA PO)   Oral   Take 1 tablet by mouth daily.           Marland Kitchen omeprazole (PRILOSEC OTC) 20 MG tablet   Oral   Take 1 tablet (20 mg total) by mouth 2 (two) times daily before a meal.   60 tablet   11   . zolpidem (AMBIEN CR) 6.25 MG CR tablet   Oral   Take 1 tablet (6.25 mg total) by mouth at bedtime.   30 tablet   5     BP 159/81  Pulse 115  Temp(Src) 99 F (37.2 C) (Oral)  Resp 18  SpO2 100%  Physical Exam  Nursing note and vitals reviewed. Constitutional: She appears well-developed and well-nourished. No distress.  HENT:  Head: Normocephalic and atraumatic.  Right Ear: Tympanic membrane and ear  canal normal.  Left Ear: Tympanic membrane and ear canal normal.  Nose: Mucosal edema and rhinorrhea present. Right sinus exhibits no maxillary sinus tenderness and no frontal sinus tenderness. Left sinus exhibits no maxillary sinus tenderness and no frontal sinus tenderness.  Mouth/Throat: Uvula is midline, oropharynx is clear and moist and mucous membranes are normal.  Neck: Normal range of motion. Neck supple.  Cardiovascular: Normal rate, regular rhythm, normal heart sounds and intact distal pulses.  Exam reveals no gallop and no friction rub.   No murmur heard. Pulmonary/Chest: Effort normal. No accessory muscle usage. Not tachypneic. No respiratory distress. She has no decreased breath sounds. She has wheezes. She has no rales. She exhibits no tenderness.  Diffuse expiratory and inspiratory wheezing Patient able to speak in complete sentences  Musculoskeletal: Normal range of motion.  Neurological: She is alert.  Skin: Skin is warm and dry. She is not diaphoretic.  Psychiatric: She has a normal mood and affect.    ED Course  Procedures (including critical care time)  Labs Reviewed - No data to display Dg Chest 2 View  12/17/2012  *RADIOLOGY REPORT*  Clinical Data: Cough, shortness of breath, history hypertension  CHEST - 2 VIEW  Comparison: 07/10/2012  Findings: Normal heart size, mediastinal contours, and pulmonary vascularity. Lungs clear. No pleural effusion or pneumothorax. Bones unremarkable.  IMPRESSION: No acute abnormalities.   Original Report Authenticated By: Ulyses Southward, M.D.      No diagnosis found.  Reassessed patient after breathing treatment.  She reports that her symptoms have improved somewhat.  Patient continues to have diffuse expiratory and inspiratory wheezing.   Will order another breathing treatment.     Reassessed patient again after 2nd breathing treatment.  She reports that she is feeling "a whole lot better."  Lungs continue to have mild wheezing, but has  improved significantly.    Patient ambulated in the ED.  Pulse ox 94-95 on RA with ambulation.    Patient ambulated in the ED and pulse ox remained at 94-95%   MDM  Patient presenting with chest tightness, wheezing, and SOB.  Similar symptoms in the past.  Negative CXR.  Symptoms significantly improved after given Solumedrol and breathing treatments.  Patient is tachycardic, but has history of tachycardia and also has been given several breathing treatments  with Albuterol.  Therefore, feel that this is most likely the reason for the tachycardia.  Patient able to ambulate without difficulty and maintained pulse ox 94-95 on RA.  Feel that the patient is stable for discharge.        Pascal Lux Sterling, PA-C 12/18/12 1538

## 2012-12-18 NOTE — ED Notes (Signed)
Removed IV from patient's right finger.

## 2012-12-18 NOTE — ED Notes (Signed)
Pt ambulated in hallway without difficulty- o2 stats maintained 93-94% RA, no distress noted.

## 2012-12-19 NOTE — ED Provider Notes (Signed)
Medical screening examination/treatment/procedure(s) were performed by non-physician practitioner and as supervising physician I was immediately available for consultation/collaboration.  Raeford Razor, MD 12/19/12 [phone]

## 2012-12-23 NOTE — Addendum Note (Signed)
Addended by: Boone Master E on: 12/23/2012 03:04 PM   Modules accepted: Orders

## 2013-01-22 ENCOUNTER — Emergency Department (HOSPITAL_COMMUNITY): Payer: Medicare Other

## 2013-01-22 ENCOUNTER — Inpatient Hospital Stay (HOSPITAL_COMMUNITY)
Admission: EM | Admit: 2013-01-22 | Discharge: 2013-01-24 | DRG: 203 | Disposition: A | Discharge status: Home / Self Care | Payer: Medicare Other | Attending: Family Medicine | Admitting: Family Medicine

## 2013-01-22 ENCOUNTER — Encounter (HOSPITAL_COMMUNITY): Payer: Self-pay | Admitting: *Deleted

## 2013-01-22 DIAGNOSIS — J9801 Acute bronchospasm: Secondary | ICD-10-CM

## 2013-01-22 DIAGNOSIS — E785 Hyperlipidemia, unspecified: Secondary | ICD-10-CM

## 2013-01-22 DIAGNOSIS — R0982 Postnasal drip: Secondary | ICD-10-CM

## 2013-01-22 DIAGNOSIS — Z79899 Other long term (current) drug therapy: Secondary | ICD-10-CM

## 2013-01-22 DIAGNOSIS — J209 Acute bronchitis, unspecified: Principal | ICD-10-CM | POA: Diagnosis present

## 2013-01-22 DIAGNOSIS — J019 Acute sinusitis, unspecified: Secondary | ICD-10-CM | POA: Diagnosis present

## 2013-01-22 DIAGNOSIS — R059 Cough, unspecified: Secondary | ICD-10-CM

## 2013-01-22 DIAGNOSIS — Z8249 Family history of ischemic heart disease and other diseases of the circulatory system: Secondary | ICD-10-CM

## 2013-01-22 DIAGNOSIS — F3289 Other specified depressive episodes: Secondary | ICD-10-CM | POA: Diagnosis present

## 2013-01-22 DIAGNOSIS — J45991 Cough variant asthma: Secondary | ICD-10-CM | POA: Diagnosis present

## 2013-01-22 DIAGNOSIS — Z825 Family history of asthma and other chronic lower respiratory diseases: Secondary | ICD-10-CM

## 2013-01-22 DIAGNOSIS — R0600 Dyspnea, unspecified: Secondary | ICD-10-CM

## 2013-01-22 DIAGNOSIS — R05 Cough: Secondary | ICD-10-CM

## 2013-01-22 DIAGNOSIS — F329 Major depressive disorder, single episode, unspecified: Secondary | ICD-10-CM | POA: Diagnosis present

## 2013-01-22 DIAGNOSIS — M545 Low back pain, unspecified: Secondary | ICD-10-CM

## 2013-01-22 DIAGNOSIS — J40 Bronchitis, not specified as acute or chronic: Secondary | ICD-10-CM

## 2013-01-22 DIAGNOSIS — R42 Dizziness and giddiness: Secondary | ICD-10-CM

## 2013-01-22 DIAGNOSIS — R000 Tachycardia, unspecified: Secondary | ICD-10-CM

## 2013-01-22 DIAGNOSIS — I10 Essential (primary) hypertension: Secondary | ICD-10-CM

## 2013-01-22 DIAGNOSIS — F32A Depression, unspecified: Secondary | ICD-10-CM

## 2013-01-22 DIAGNOSIS — IMO0001 Reserved for inherently not codable concepts without codable children: Secondary | ICD-10-CM

## 2013-01-22 DIAGNOSIS — I700 Atherosclerosis of aorta: Secondary | ICD-10-CM | POA: Diagnosis present

## 2013-01-22 DIAGNOSIS — Z823 Family history of stroke: Secondary | ICD-10-CM

## 2013-01-22 DIAGNOSIS — J329 Chronic sinusitis, unspecified: Secondary | ICD-10-CM

## 2013-01-22 HISTORY — DX: Chronic sinusitis, unspecified: J32.9

## 2013-01-22 LAB — CBC WITH DIFFERENTIAL/PLATELET
Lymphocytes Relative: 17 % (ref 12–46)
Lymphs Abs: 1.9 10*3/uL (ref 0.7–4.0)
MCV: 88.2 fL (ref 78.0–100.0)
Neutrophils Relative %: 70 % (ref 43–77)
Platelets: 310 10*3/uL (ref 150–400)
RBC: 4.33 MIL/uL (ref 3.87–5.11)
WBC: 11.2 10*3/uL — ABNORMAL HIGH (ref 4.0–10.5)

## 2013-01-22 LAB — URINALYSIS, ROUTINE W REFLEX MICROSCOPIC
Glucose, UA: NEGATIVE mg/dL
Leukocytes, UA: NEGATIVE
Specific Gravity, Urine: 1.009 (ref 1.005–1.030)
pH: 5.5 (ref 5.0–8.0)

## 2013-01-22 LAB — COMPREHENSIVE METABOLIC PANEL
Alkaline Phosphatase: 139 U/L — ABNORMAL HIGH (ref 39–117)
BUN: 12 mg/dL (ref 6–23)
Calcium: 10.3 mg/dL (ref 8.4–10.5)
GFR calc Af Amer: 48 mL/min — ABNORMAL LOW (ref >90)
GFR calc non Af Amer: 41 mL/min — ABNORMAL LOW (ref >90)
Glucose, Bld: 113 mg/dL — ABNORMAL HIGH (ref 70–99)
Total Protein: 8.4 g/dL — ABNORMAL HIGH (ref 6.0–8.3)

## 2013-01-22 LAB — PRO B NATRIURETIC PEPTIDE: Pro B Natriuretic peptide (BNP): 154.9 pg/mL — ABNORMAL HIGH (ref 0–125)

## 2013-01-22 LAB — LIPASE, BLOOD: Lipase: 22 U/L (ref 11–59)

## 2013-01-22 MED ORDER — ALBUTEROL SULFATE (5 MG/ML) 0.5% IN NEBU
2.5000 mg | INHALATION_SOLUTION | RESPIRATORY_TRACT | Status: DC
  Administered 2013-01-22 – 2013-01-23 (×3): 2.5 mg via RESPIRATORY_TRACT
  Filled 2013-01-22 (×3): qty 0.5

## 2013-01-22 MED ORDER — MECLIZINE HCL 25 MG PO TABS
25.0000 mg | ORAL_TABLET | Freq: Once | ORAL | Status: AC
  Administered 2013-01-23: 25 mg via ORAL
  Filled 2013-01-22: qty 1

## 2013-01-22 MED ORDER — PREDNISONE 20 MG PO TABS
60.0000 mg | ORAL_TABLET | Freq: Once | ORAL | Status: AC
  Administered 2013-01-22: 60 mg via ORAL
  Filled 2013-01-22: qty 3

## 2013-01-22 MED ORDER — SODIUM CHLORIDE 0.9 % IV SOLN
Freq: Once | INTRAVENOUS | Status: AC
  Administered 2013-01-23: via INTRAVENOUS

## 2013-01-22 MED ORDER — IPRATROPIUM BROMIDE 0.02 % IN SOLN
0.5000 mg | RESPIRATORY_TRACT | Status: DC
  Administered 2013-01-22 – 2013-01-23 (×3): 0.5 mg via RESPIRATORY_TRACT
  Filled 2013-01-22 (×3): qty 2.5

## 2013-01-22 MED ORDER — SODIUM CHLORIDE 0.9 % IV BOLUS (SEPSIS)
500.0000 mL | Freq: Once | INTRAVENOUS | Status: AC
  Administered 2013-01-22: 500 mL via INTRAVENOUS

## 2013-01-22 NOTE — ED Notes (Signed)
Pt c/o chronic cough for 43yr pt has been to pulmonary MD, and ENT that she sees  And was recently dx with chronic sinusitis. Pt states she gets tx and does well for a month and then her cough returns. Pt presents today because cough has gotten worse, and its dry and hacking. Pt also states she has dizziness, pulsating in legs, and abd distention and weight gain.

## 2013-01-22 NOTE — ED Provider Notes (Signed)
History     CSN: [id number]  Arrival date & time 01/22/13  1836   First MD Initiated Contact with Patient 01/22/13 1959      Chief Complaint  Patient presents with  . Cough  . Dizziness    (Consider location/radiation/quality/duration/timing/severity/associated sxs/prior treatment) HPI  Patient is a 62 year old female past medical history significant for chronic cough, chronic sinusitis, tachycardia, h/o GI bleed, presenting with multiple complaints, most concerning complaint is a nonproductive cough since five days ago with associated shortness of breath, nausea, lightheadedness, bilateral calf "pulsating", decreased PO intake. Has seen Pulmonologist in past for chronic cough with negative work up and no formal diagnosis of COPD or asthma. Was advised by pulmonologist to take delsym for chronic cough and has been doing so w/o relief these last five days. Last visit to pulmonologist was in Jan 2014. No home oxygen. Never been intubated. Never smoked, but lives in house 20+ years with second hand smoke. Patient's second complaint is dull epigastric abdominal pain without radiation that began four days ago. Denies any fevers, chills, hematochezia, hematemesis, nausea, emesis.   Past Medical History  Diagnosis Date  . Lower back pain   . HTN (hypertension)   . Dyslipidemia   . Mild aortic sclerosis   . Tachycardia   . Normal nuclear stress test July 2010  . H/O: GI bleed   . Depression   . Chronic sinusitis     Past Surgical History  Procedure Laterality Date  . Tubal ligation    . Abdominal hysterectomy    . Back surgery      Family History  Problem Relation Age of Onset  . Stroke Father   . Heart disease Mother     CABG  . Heart attack Mother   . Asthma Grandchild     History  Substance Use Topics  . Smoking status: Never Smoker   . Smokeless tobacco: Never Used     Comment: Lived with a smoker for at least 20 years  . Alcohol Use: No    OB History   Grav Para  Term Preterm Abortions TAB SAB Ect Mult Living                  Review of Systems  Constitutional: Positive for appetite change.  HENT: Positive for congestion. Negative for neck pain and neck stiffness.   Eyes: Negative for photophobia, pain and visual disturbance.  Respiratory: Positive for cough, chest tightness and shortness of breath.   Cardiovascular: Negative.   Gastrointestinal: Positive for abdominal distention.  Genitourinary: Negative for dysuria, decreased urine volume, vaginal bleeding, vaginal discharge, vaginal pain and pelvic pain.  Musculoskeletal: Negative.   Skin: Negative.   Neurological: Positive for dizziness and light-headedness.  Psychiatric/Behavioral: Negative.     Allergies  Review of patient's allergies indicates no known allergies.  Home Medications   Current Outpatient Rx  Name  Route  Sig  Dispense  Refill  . albuterol (PROVENTIL HFA;VENTOLIN HFA) 108 (90 BASE) MCG/ACT inhaler   Inhalation   Inhale 2 puffs into the lungs every 6 (six) hours as needed for wheezing.   1 Inhaler   2   . amLODipine (NORVASC) 5 MG tablet   Oral   Take 5 mg by mouth daily.          . bisoprolol (ZEBETA) 10 MG tablet   Oral   Take 1 tablet (10 mg total) by mouth daily.   60 tablet   11   . dextromethorphan (DELSYM)  30 MG/5ML liquid   Oral   Take 60 mg by mouth 2 (two) times daily as needed for cough.          . DULoxetine (CYMBALTA) 60 MG capsule   Oral   Take 60 mg by mouth daily.         . famotidine (PEPCID) 20 MG tablet   Oral   Take 20 mg by mouth at bedtime.          . hydrochlorothiazide (HYDRODIURIL) 25 MG tablet   Oral   Take 12.5 mg by mouth daily.          . Multiple Vitamins-Calcium (ONE-A-DAY WOMENS FORMULA PO)   Oral   Take 1 tablet by mouth daily.           Marland Kitchen omeprazole (PRILOSEC OTC) 20 MG tablet   Oral   Take 1 tablet (20 mg total) by mouth 2 (two) times daily before a meal.   60 tablet   11   . zolpidem (AMBIEN  CR) 6.25 MG CR tablet   Oral   Take 6.25 mg by mouth at bedtime as needed for sleep.          Marland Kitchen Respiratory Therapy Supplies (FLUTTER) DEVI      as needed (for cough/congestion).           BP 142/75  Pulse 108  Temp(Src) 98.3 F (36.8 C) (Oral)  Resp 24  Ht 5\' 1"  (1.549 m)  Wt 144 lb (65.318 kg)  BMI 27.22 kg/m2  SpO2 98%  Physical Exam  Constitutional: She is oriented to person, place, and time. She appears well-developed and well-nourished.  Patient sitting up, talking in full sentences, in NAD, w/o respiratory distress.   HENT:  Head: Normocephalic and atraumatic.  Mouth/Throat: Oropharynx is clear and moist. No oropharyngeal exudate.  Eyes: Conjunctivae and EOM are normal. Pupils are equal, round, and reactive to light. Right eye exhibits no discharge. Left eye exhibits no discharge.  Neck: Neck supple. No tracheal deviation present.  Cardiovascular: Regular rhythm, normal heart sounds and intact distal pulses.  Tachycardia present.   Pulses:      Dorsalis pedis pulses are 2+ on the right side, and 2+ on the left side.       Posterior tibial pulses are 2+ on the right side, and 2+ on the left side.  Pulmonary/Chest: Effort normal. No accessory muscle usage. No respiratory distress. She has no decreased breath sounds. She has no wheezes. She has no rhonchi. She has no rales.  Abdominal: Soft. Bowel sounds are normal. There is no rigidity, no rebound and no guarding.  Lymphadenopathy:    She has no cervical adenopathy.  Neurological: She is alert and oriented to person, place, and time. She has normal strength. No cranial nerve deficit or sensory deficit. Gait normal.  Skin: Skin is warm, dry and intact. She is not diaphoretic. No erythema. No pallor.    ED Course  Procedures (including critical care time)  PERC negative Wells Criteria negative  Ambulated w/ O2 monitored and sats dropped down to 88%.   Medications  ipratropium (ATROVENT) nebulizer solution 0.5  mg (0.5 mg Nebulization Given 01/22/13 2247)    And  albuterol (PROVENTIL) (5 MG/ML) 0.5% nebulizer solution 2.5 mg (2.5 mg Nebulization Given 01/22/13 2247)  predniSONE (DELTASONE) tablet 60 mg (60 mg Oral Given 01/22/13 2145)  sodium chloride 0.9 % bolus 500 mL (0 mLs Intravenous Stopped 01/23/13 0005)  0.9 %  sodium chloride infusion (  Intravenous New Bag/Given 01/23/13 0005)  meclizine (ANTIVERT) tablet 25 mg (25 mg Oral Given 01/23/13 0005)    Labs Reviewed  PRO B NATRIURETIC PEPTIDE - Abnormal; Notable for the following:    Pro B Natriuretic peptide (BNP) 154.9 (*)    All other components within normal limits  CBC WITH DIFFERENTIAL - Abnormal; Notable for the following:    WBC 11.2 (*)    Neutro Abs 7.9 (*)    Monocytes Absolute 1.2 (*)    All other components within normal limits  TROPONIN I   Dg Chest 2 View  01/22/2013  *RADIOLOGY REPORT*  Clinical Data: Cough and congestion  CHEST - 2 VIEW  Comparison: 12/17/2012  Findings: Lumbar fusion hardware partly visualized.  Heart size is normal.  The lungs are clear.  No pleural effusion.  Leftward curvature of the thoracic spine again noted.  IMPRESSION: No acute cardiopulmonary process.   Original Report Authenticated By: Christiana Pellant, M.D.    Dg Abd 1 View  01/22/2013  *RADIOLOGY REPORT*  Clinical Data: Epigastric pain.  ABDOMEN - 1 VIEW  Comparison: Lumbar radiographs dated 04/20/2011  Findings: The bowel gas pattern is normal with air scattered throughout the nondistended colon.  No dilated small bowel. Stomach is not distended.  No acute osseous abnormality.  No visible free air free fluid. Previous lower lumbar fusion.  IMPRESSION: No acute abnormality.   Original Report Authenticated By: Francene Boyers, M.D.    Hospitalist consulted for admission.   1. Dyspnea   2. Cough   3. Bronchospasm   4. Bronchitis       MDM  Patient is a 62 yo F PMHx significant for chronic cough, chronic sinusitis, tachycardia, h/o GI bleed,  presenting with multiple complaints, most concerning complaint is a nonproductive cough since five days ago with associated shortness of breath, nausea, lightheadedness, bilateral calf "pulsating", decreased PO intake. Not on home oxygen. Patient has been worked up by pulmonologist w/o significant findings to explain chronic cough. No respiratory distress, no accessory muscle use, no cyanosis, patient able to talk in full sentences w/o gasping for air. Lungs CTA bilaterally. Labs showed mildly elevated WBC, BNP elevated to level to indicate pulmonary etiology of SOB. Pt unable to ambulate w/o SATs dropping to 88%. Hospitalist consulted and patient will be admitted. Patient advised of plan. Agreeable to plan. Patient is stable and ready for transport. Patient d/w with Dr. Clarene Duke, agrees with plan.             Jeannetta Ellis, PA-C 01/23/13 0131

## 2013-01-22 NOTE — ED Notes (Signed)
Pt states abd is "bloated" and "heavy feeling."

## 2013-01-22 NOTE — ED Notes (Addendum)
Pt c/o productive cough since Fri that has turned into a dry non-productive cough and sob today (Pt sees pulmonary MD for this cough) and dizziness since Sat.  Also c/o abd swellling, "like there's something there, in my stomach", starting today as well as "pulsating calves".  Able to speak in clear sentences.  VS stable.

## 2013-01-23 ENCOUNTER — Encounter (HOSPITAL_COMMUNITY): Payer: Self-pay

## 2013-01-23 DIAGNOSIS — R059 Cough, unspecified: Secondary | ICD-10-CM

## 2013-01-23 DIAGNOSIS — R0982 Postnasal drip: Secondary | ICD-10-CM | POA: Diagnosis present

## 2013-01-23 DIAGNOSIS — J329 Chronic sinusitis, unspecified: Secondary | ICD-10-CM | POA: Diagnosis present

## 2013-01-23 DIAGNOSIS — R0609 Other forms of dyspnea: Secondary | ICD-10-CM

## 2013-01-23 DIAGNOSIS — R42 Dizziness and giddiness: Secondary | ICD-10-CM | POA: Diagnosis present

## 2013-01-23 DIAGNOSIS — J40 Bronchitis, not specified as acute or chronic: Secondary | ICD-10-CM

## 2013-01-23 DIAGNOSIS — R05 Cough: Secondary | ICD-10-CM

## 2013-01-23 DIAGNOSIS — J9801 Acute bronchospasm: Secondary | ICD-10-CM

## 2013-01-23 LAB — BASIC METABOLIC PANEL
GFR calc Af Amer: 57 mL/min — ABNORMAL LOW (ref >90)
GFR calc non Af Amer: 49 mL/min — ABNORMAL LOW (ref >90)
Glucose, Bld: 188 mg/dL — ABNORMAL HIGH (ref 70–99)
Potassium: 3.1 mEq/L — ABNORMAL LOW (ref 3.5–5.1)
Sodium: 136 mEq/L (ref 135–145)

## 2013-01-23 MED ORDER — HEPARIN SODIUM (PORCINE) 5000 UNIT/ML IJ SOLN
5000.0000 [IU] | Freq: Three times a day (TID) | INTRAMUSCULAR | Status: DC
  Administered 2013-01-23 – 2013-01-24 (×4): 5000 [IU] via SUBCUTANEOUS
  Filled 2013-01-23 (×7): qty 1

## 2013-01-23 MED ORDER — ALBUTEROL SULFATE (5 MG/ML) 0.5% IN NEBU: 2.5000 mg | INHALATION_SOLUTION | RESPIRATORY_TRACT | Status: DC | PRN

## 2013-01-23 MED ORDER — ALBUTEROL SULFATE (5 MG/ML) 0.5% IN NEBU: 10.0000 mg | INHALATION_SOLUTION | Freq: Once | RESPIRATORY_TRACT | Status: DC

## 2013-01-23 MED ORDER — LORATADINE 10 MG PO TABS
10.0000 mg | ORAL_TABLET | Freq: Every day | ORAL | Status: DC
  Administered 2013-01-23 – 2013-01-24 (×2): 10 mg via ORAL
  Filled 2013-01-23 (×2): qty 1

## 2013-01-23 MED ORDER — AMLODIPINE BESYLATE 5 MG PO TABS
5.0000 mg | ORAL_TABLET | Freq: Every day | ORAL | Status: DC
  Administered 2013-01-23 – 2013-01-24 (×2): 5 mg via ORAL
  Filled 2013-01-23 (×2): qty 1

## 2013-01-23 MED ORDER — IPRATROPIUM BROMIDE 0.02 % IN SOLN
1.0000 mg | Freq: Once | RESPIRATORY_TRACT | Status: AC
  Administered 2013-01-23: 1 mg via RESPIRATORY_TRACT
  Filled 2013-01-23: qty 5

## 2013-01-23 MED ORDER — HYDROCOD POLST-CHLORPHEN POLST 10-8 MG/5ML PO LQCR
5.0000 mL | Freq: Two times a day (BID) | ORAL | Status: DC
  Administered 2013-01-23 – 2013-01-24 (×3): 5 mL via ORAL
  Filled 2013-01-23 (×3): qty 5

## 2013-01-23 MED ORDER — ACETAMINOPHEN 325 MG PO TABS
650.0000 mg | ORAL_TABLET | Freq: Four times a day (QID) | ORAL | Status: DC | PRN
  Administered 2013-01-23: 650 mg via ORAL
  Filled 2013-01-23: qty 2

## 2013-01-23 MED ORDER — BISOPROLOL FUMARATE 10 MG PO TABS
10.0000 mg | ORAL_TABLET | Freq: Every day | ORAL | Status: DC
  Administered 2013-01-23 – 2013-01-24 (×2): 10 mg via ORAL
  Filled 2013-01-23 (×2): qty 1

## 2013-01-23 MED ORDER — DULOXETINE HCL 60 MG PO CPEP
60.0000 mg | ORAL_CAPSULE | Freq: Every day | ORAL | Status: DC
  Administered 2013-01-23 – 2013-01-24 (×2): 60 mg via ORAL
  Filled 2013-01-23 (×2): qty 1

## 2013-01-23 MED ORDER — PANTOPRAZOLE SODIUM 40 MG PO TBEC
40.0000 mg | DELAYED_RELEASE_TABLET | Freq: Two times a day (BID) | ORAL | Status: DC
  Administered 2013-01-23 – 2013-01-24 (×3): 40 mg via ORAL
  Filled 2013-01-23 (×5): qty 1

## 2013-01-23 MED ORDER — METHYLPREDNISOLONE SODIUM SUCC 125 MG IJ SOLR
60.0000 mg | Freq: Four times a day (QID) | INTRAMUSCULAR | Status: DC
  Administered 2013-01-23 – 2013-01-24 (×5): 60 mg via INTRAVENOUS
  Filled 2013-01-23 (×4): qty 0.96
  Filled 2013-01-23: qty 2
  Filled 2013-01-23 (×6): qty 0.96

## 2013-01-23 MED ORDER — HYDROCHLOROTHIAZIDE 12.5 MG PO CAPS
12.5000 mg | ORAL_CAPSULE | Freq: Every day | ORAL | Status: DC
  Administered 2013-01-23 – 2013-01-24 (×2): 12.5 mg via ORAL
  Filled 2013-01-23 (×2): qty 1

## 2013-01-23 MED ORDER — ALBUTEROL (5 MG/ML) CONTINUOUS INHALATION SOLN
INHALATION_SOLUTION | RESPIRATORY_TRACT | Status: AC
  Administered 2013-01-23: 02:00:00
  Filled 2013-01-23: qty 20

## 2013-01-23 MED ORDER — POTASSIUM CHLORIDE CRYS ER 20 MEQ PO TBCR
40.0000 meq | EXTENDED_RELEASE_TABLET | Freq: Once | ORAL | Status: AC
  Administered 2013-01-23: 40 meq via ORAL
  Filled 2013-01-23: qty 2

## 2013-01-23 MED ORDER — HYDROCHLOROTHIAZIDE 25 MG PO TABS: 12.5000 mg | ORAL_TABLET | Freq: Every day | ORAL | Status: DC

## 2013-01-23 MED ORDER — MENTHOL 3 MG MT LOZG
1.0000 | LOZENGE | OROMUCOSAL | Status: DC | PRN
  Administered 2013-01-23 (×2): 3 mg via ORAL
  Filled 2013-01-23: qty 9

## 2013-01-23 MED ORDER — CEFUROXIME AXETIL 500 MG PO TABS
500.0000 mg | ORAL_TABLET | Freq: Two times a day (BID) | ORAL | Status: DC
  Administered 2013-01-23 – 2013-01-24 (×3): 500 mg via ORAL
  Filled 2013-01-23 (×5): qty 1

## 2013-01-23 MED ORDER — ZOLPIDEM TARTRATE 5 MG PO TABS
5.0000 mg | ORAL_TABLET | Freq: Every evening | ORAL | Status: DC | PRN
  Administered 2013-01-23: 5 mg via ORAL
  Filled 2013-01-23: qty 1

## 2013-01-23 MED ORDER — LEVALBUTEROL HCL 0.63 MG/3ML IN NEBU
0.6300 mg | INHALATION_SOLUTION | Freq: Four times a day (QID) | RESPIRATORY_TRACT | Status: DC
  Administered 2013-01-23 – 2013-01-24 (×3): 0.63 mg via RESPIRATORY_TRACT
  Filled 2013-01-23 (×8): qty 3

## 2013-01-23 MED ORDER — IPRATROPIUM BROMIDE 0.02 % IN SOLN
0.5000 mg | Freq: Four times a day (QID) | RESPIRATORY_TRACT | Status: DC
  Administered 2013-01-23 – 2013-01-24 (×3): 0.5 mg via RESPIRATORY_TRACT
  Filled 2013-01-23 (×4): qty 2.5

## 2013-01-23 MED ORDER — FLUTICASONE PROPIONATE 50 MCG/ACT NA SUSP
2.0000 | Freq: Every day | NASAL | Status: DC
  Administered 2013-01-23 – 2013-01-24 (×2): 2 via NASAL
  Filled 2013-01-23: qty 16

## 2013-01-23 MED ORDER — FAMOTIDINE 20 MG PO TABS
20.0000 mg | ORAL_TABLET | Freq: Every day | ORAL | Status: DC
  Administered 2013-01-23: 20 mg via ORAL
  Filled 2013-01-23 (×2): qty 1

## 2013-01-23 NOTE — ED Notes (Signed)
Pt sats drops from 97% to 88% while ambulating. No shortness of breath pt just complaining of dizziness.

## 2013-01-23 NOTE — Progress Notes (Signed)
Patient ID: Teresa Benson  female  VHQ:[id number]    DOB: 1951-01-10    DOA: 01/22/2013  PCP: [name], MD  Patient admitted by Dr. Julian Reil this morning, examined, H&P reviewed, agree with assessment and plan  Assessment/Plan: Principal Problem: Acute bronchitis with wheezing worsened with acute flare/sinusitis - Placed on Xopenex and Atrovent nebs, continue flonase - Significant wheezing on my exam, started on IV steroids, will likely need prednisone taper upon DC - Placed on Ceftin, Tussionex - Advised the patient to make a followup appointment with pulmonology next week  - Continue PPI and Pepcid   Active Problems:   Chronic sinusitis- continue Ceftin, Claritin, Flonase spray   Hypertension- stable   Dizziness- stable  DVT Prophylaxis:  Code Status:  Disposition:Hopefully DC home in a.m.     Subjective: Significant wheezing on the exam, no hypoxia, cough Improving  Objective: Weight change:   Intake/Output Summary (Last 24 hours) at 01/23/13 1438 Last data filed at 01/23/13 1412  Gross per 24 hour  Intake    120 ml  Output      0 ml  Net    120 ml   Blood pressure 114/59, pulse 105, temperature 97.7 F (36.5 C), temperature source Axillary, resp. rate 18, height 5\' 1"  (1.549 m), weight 65.6 kg (144 lb 10 oz), SpO2 95.00%.  Physical Exam: General: Alert and awake, oriented x3, not in any acute distress.  CVS: S1-S2 clear, no murmur rubs or gallops Chest: Bilateral wheezing Abdomen: soft nontender, nondistended, normal bowel sounds, Extremities: no cyanosis, clubbing or edema noted bilaterally   Lab Results: Basic Metabolic Panel:  Recent Labs Lab 01/22/13 2238 01/23/13 0312  NA 140 136  K 4.0 3.1*  CL 99 100  CO2 27 23  GLUCOSE 113* 188*  BUN 12 12  CREATININE 1.35* 1.17*  CALCIUM 10.3 9.2   Liver Function Tests:  Recent Labs Lab 01/22/13 2238  AST 35  ALT 16  ALKPHOS 139*  BILITOT 0.3  PROT 8.4*  ALBUMIN 3.8    Recent Labs Lab  01/22/13 2238  LIPASE 22   No results found for this basename: AMMONIA,  in the last 168 hours CBC:  Recent Labs Lab 01/22/13 1851  WBC 11.2*  NEUTROABS 7.9*  HGB 13.7  HCT 38.2  MCV 88.2  PLT 310   Cardiac Enzymes:  Recent Labs Lab 01/22/13 1851  TROPONINI <0.30   BNP: No components found with this basename: POCBNP,  CBG: No results found for this basename: GLUCAP,  in the last 168 hours   Micro Results: No results found for this or any previous visit (from the past 240 hour(s)).  Studies/Results: Dg Chest 2 View  01/22/2013  *RADIOLOGY REPORT*  Clinical Data: Cough and congestion  CHEST - 2 VIEW  Comparison: 12/17/2012  Findings: Lumbar fusion hardware partly visualized.  Heart size is normal.  The lungs are clear.  No pleural effusion.  Leftward curvature of the thoracic spine again noted.  IMPRESSION: No acute cardiopulmonary process.   Original Report Authenticated By: Christiana Pellant, M.D.    Dg Abd 1 View  01/22/2013  *RADIOLOGY REPORT*  Clinical Data: Epigastric pain.  ABDOMEN - 1 VIEW  Comparison: Lumbar radiographs dated 04/20/2011  Findings: The bowel gas pattern is normal with air scattered throughout the nondistended colon.  No dilated small bowel. Stomach is not distended.  No acute osseous abnormality.  No visible free air free fluid. Previous lower lumbar fusion.  IMPRESSION: No acute abnormality.   Original Report  Authenticated By: Francene Boyers, M.D.     Medications: Scheduled Meds: . albuterol  10 mg Nebulization Once  . amLODipine  5 mg Oral Daily  . bisoprolol  10 mg Oral Daily  . cefUROXime  500 mg Oral BID WC  . chlorpheniramine-HYDROcodone  5 mL Oral Q12H  . DULoxetine  60 mg Oral Daily  . famotidine  20 mg Oral QHS  . fluticasone  2 spray Each Nare Daily  . heparin subcutaneous  5,000 Units Subcutaneous Q8H  . hydrochlorothiazide  12.5 mg Oral Daily  . levalbuterol  0.63 mg Nebulization Q6H   And  . ipratropium  0.5 mg Nebulization Q6H   . methylPREDNISolone (SOLU-MEDROL) injection  60 mg Intravenous Q6H  . pantoprazole  40 mg Oral BID AC      LOS: 1 day   RAI,RIPUDEEP M.D. [facility] 01/23/2013, 2:38 PM Pager: [phone]  If 7PM-7AM, please contact night-coverage [email or web address] Password [email or web address]

## 2013-01-23 NOTE — Progress Notes (Signed)
Utilization review completed. Nellie Pester, RN, BSN. 

## 2013-01-23 NOTE — H&P (Signed)
[facility] History and Physical  Lania Zawistowski ZOX:[id number] DOB: [birth year] DOA: 01/22/2013  Referring physician: ED PCP: [name], MD  Specialists: Sherene Sires (pulm)  Chief Complaint: Cough, dizziness  HPI: Teresa Benson is a 62 y.o. female history significant for chronic cough, chronic sinusitis (also seeing ENT for this), tachycardia, who presents with c/o nonproductive cough worse over the past 5 days, nausea, light headedness and dizziness.  Has seen pulmonologist in past for chronic cough with negative workup, normal PFTs, no formal diagnosis of COPD nor asthma.  No fevers nor chills, not really short of breath except when coughing.  Does have significant dizziness.  In past has been given steroid tapers which work until she ends the steroid taper and her cough comes back.  In the ED, patient was given breathing treatments, prednisone 60mg  x1, when they got the patient up to ambulate her, her O2 sat reportedly dropped to 88% on RA (uses no O2 at home and has 100% O2 sats at rest).  Hospitalist has been asked to admit the patient.  Review of Systems: 12 systems reviewed and otherwise negative.  Past Medical History  Diagnosis Date  . Lower back pain   . HTN (hypertension)   . Dyslipidemia   . Mild aortic sclerosis   . Tachycardia   . Normal nuclear stress test July 2010  . H/O: GI bleed   . Depression   . Chronic sinusitis    Past Surgical History  Procedure Laterality Date  . Tubal ligation    . Abdominal hysterectomy    . Back surgery     Social History:  reports that she has never smoked. She has never used smokeless tobacco. She reports that she does not drink alcohol or use illicit drugs. Husband smoked 1 PPD for 18 years in same house hold.  No Known Allergies  Family History  Problem Relation Age of Onset  . Stroke Father   . Heart disease Mother     CABG  . Heart attack Mother   . Asthma Grandchild     Prior to Admission medications   Medication  Sig Start Date End Date Taking? Authorizing Provider  albuterol (PROVENTIL HFA;VENTOLIN HFA) 108 (90 BASE) MCG/ACT inhaler Inhale 2 puffs into the lungs every 6 (six) hours as needed for wheezing. 12/18/12  Yes Heather Zenaida Niece Wingen, PA-C  amLODipine (NORVASC) 5 MG tablet Take 5 mg by mouth daily.  11/05/11  Yes Historical Provider, MD  bisoprolol (ZEBETA) 10 MG tablet Take 1 tablet (10 mg total) by mouth daily. 07/10/12  Yes Nyoka Cowden, MD  dextromethorphan (DELSYM) 30 MG/5ML liquid Take 60 mg by mouth 2 (two) times daily as needed for cough.    Yes Historical Provider, MD  DULoxetine (CYMBALTA) 60 MG capsule Take 60 mg by mouth daily.   Yes Historical Provider, MD  famotidine (PEPCID) 20 MG tablet Take 20 mg by mouth at bedtime.  08/14/12  Yes Nyoka Cowden, MD  hydrochlorothiazide (HYDRODIURIL) 25 MG tablet Take 12.5 mg by mouth daily.    Yes Historical Provider, MD  Multiple Vitamins-Calcium (ONE-A-DAY WOMENS FORMULA PO) Take 1 tablet by mouth daily.     Yes Historical Provider, MD  omeprazole (PRILOSEC OTC) 20 MG tablet Take 1 tablet (20 mg total) by mouth 2 (two) times daily before a meal. 08/14/12  Yes Nyoka Cowden, MD  zolpidem (AMBIEN CR) 6.25 MG CR tablet Take 6.25 mg by mouth at bedtime as needed for sleep.  10/16/12  Yes Maisie Fus  Patty Sermons, MD  Respiratory Therapy Supplies (FLUTTER) DEVI as needed (for cough/congestion).    Historical Provider, MD   Physical Exam: Filed Vitals:   01/22/13 2100 01/22/13 2145 01/23/13 0141 01/23/13 0209  BP: 126/67 134/66 127/57   Pulse: 96 98 94   Temp:      TempSrc:      Resp: 9  24   Height:      Weight:      SpO2: 96% 98% 99% 98%    General:  NAD, resting comfortably in bed Eyes: PEERLA EOMI ENT: mucous membranes moist, nasal mucosa edematous with edematous turbinates. Neck: supple w/o JVD Cardiovascular: RRR w/o MRG Respiratory: CTA B, no rhonchi, wheezing, rales, or other abnormal breath sounds on exam Abdomen: soft, nt, nd, bs+ Skin: no  rash nor lesion Musculoskeletal: MAE, full ROM all 4 extremities Psychiatric: normal tone and affect Neurologic: AAOx3, grossly non-focal  Labs on Admission:  Basic Metabolic Panel:  Recent Labs Lab 01/22/13 2238  NA 140  K 4.0  CL 99  CO2 27  GLUCOSE 113*  BUN 12  CREATININE 1.35*  CALCIUM 10.3   Liver Function Tests:  Recent Labs Lab 01/22/13 2238  AST 35  ALT 16  ALKPHOS 139*  BILITOT 0.3  PROT 8.4*  ALBUMIN 3.8    Recent Labs Lab 01/22/13 2238  LIPASE 22   No results found for this basename: AMMONIA,  in the last 168 hours CBC:  Recent Labs Lab 01/22/13 1851  WBC 11.2*  NEUTROABS 7.9*  HGB 13.7  HCT 38.2  MCV 88.2  PLT 310   Cardiac Enzymes:  Recent Labs Lab 01/22/13 1851  TROPONINI <0.30    BNP (last 3 results)  Recent Labs  01/22/13 1851  PROBNP 154.9*   CBG: No results found for this basename: GLUCAP,  in the last 168 hours  Radiological Exams on Admission: Dg Chest 2 View  01/22/2013  *RADIOLOGY REPORT*  Clinical Data: Cough and congestion  CHEST - 2 VIEW  Comparison: 12/17/2012  Findings: Lumbar fusion hardware partly visualized.  Heart size is normal.  The lungs are clear.  No pleural effusion.  Leftward curvature of the thoracic spine again noted.  IMPRESSION: No acute cardiopulmonary process.   Original Report Authenticated By: Christiana Pellant, M.D.    Dg Abd 1 View  01/22/2013  *RADIOLOGY REPORT*  Clinical Data: Epigastric pain.  ABDOMEN - 1 VIEW  Comparison: Lumbar radiographs dated 04/20/2011  Findings: The bowel gas pattern is normal with air scattered throughout the nondistended colon.  No dilated small bowel. Stomach is not distended.  No acute osseous abnormality.  No visible free air free fluid. Previous lower lumbar fusion.  IMPRESSION: No acute abnormality.   Original Report Authenticated By: Francene Boyers, M.D.     EKG: Independently reviewed.  Assessment/Plan Principal Problem:   Cough Active Problems:   Chronic  sinusitis   PND (post-nasal drip)   Dizziness   1. Cough - strongly suspect PND from patients known significant chronic sinusitis is the culprit here, during my repeat ambulation the patient did demonstrate significant dizziness so will admit the patient, but her O2 sat remained at 99%.  Will hold off on giving additional systemic steroids at this point in time given the essentially negative exam of her lungs.  Will of course continue breathing treatments PRN, and reconsider steroids if she starts to show pulmonary symptoms such as wheezing etc.  Will instead try nasal steroids (which patient had script for and actually just ran  out of last month prior to symptoms becoming severe). 2. Dizziness - suspect this is secondary to her sinusitis   Code Status: Full Code (must indicate code status--if unknown or must be presumed, indicate so) Family Communication: Spoke with daughter at bedside (indicate person spoken with, if applicable, with phone number if by telephone) Disposition Plan: Admit to obs (indicate anticipated LOS)  Time spent: 30 min  GARDNER, JARED M. [facility] Pager [phone]  If 7PM-7AM, please contact night-coverage [email or web address] Password [email or web address] 01/23/2013, 2:50 AM

## 2013-01-23 NOTE — ED Notes (Signed)
[facility] @ [phone]

## 2013-01-23 NOTE — ED Provider Notes (Signed)
61yo F, c/o gradual onset and worsening of cough, SOB and "wheezing" for the past 5 days.  States she feels "dizzy" and "SOB" when she walks.  Endorses she has been compliant with her inhalers as rx by her Pulm MD but "they aren't working."  VSS, lungs diminished, RRR, abd soft/NT, neuro non-focal.  Given steroid and neb without relief.  Ambulated with Sats decreasing into 80's R/A and pt c/o increasing SOB/RR.  Will admit.   Laray Anger, DO 01/23/13 [phone]

## 2013-01-24 DIAGNOSIS — I10 Essential (primary) hypertension: Secondary | ICD-10-CM

## 2013-01-24 LAB — URINE CULTURE: Colony Count: 35000

## 2013-01-24 LAB — BASIC METABOLIC PANEL
BUN: 29 mg/dL — ABNORMAL HIGH (ref 6–23)
Chloride: 104 mEq/L (ref 96–112)
GFR calc Af Amer: 51 mL/min — ABNORMAL LOW (ref >90)
Potassium: 4.5 mEq/L (ref 3.5–5.1)
Sodium: 139 mEq/L (ref 135–145)

## 2013-01-24 MED ORDER — LORATADINE 10 MG PO TABS: 10.0000 mg | ORAL_TABLET | Freq: Every day | ORAL | Status: DC

## 2013-01-24 MED ORDER — CEFUROXIME AXETIL 250 MG PO TABS: 250.0000 mg | ORAL_TABLET | Freq: Two times a day (BID) | ORAL | Status: DC

## 2013-01-24 MED ORDER — ALBUTEROL SULFATE (5 MG/ML) 0.5% IN NEBU: 2.5000 mg | INHALATION_SOLUTION | Freq: Four times a day (QID) | RESPIRATORY_TRACT | Status: DC | PRN

## 2013-01-24 MED ORDER — IPRATROPIUM BROMIDE 0.02 % IN SOLN: 0.5000 mg | Freq: Four times a day (QID) | RESPIRATORY_TRACT | Status: DC | PRN

## 2013-01-24 MED ORDER — TRAMADOL HCL 50 MG PO TABS
50.0000 mg | ORAL_TABLET | Freq: Once | ORAL | Status: AC
  Administered 2013-01-24: 50 mg via ORAL
  Filled 2013-01-24 (×2): qty 1

## 2013-01-24 MED ORDER — FLUTICASONE PROPIONATE 50 MCG/ACT NA SUSP: 2.0000 | Freq: Every day | NASAL | Status: DC

## 2013-01-24 MED ORDER — PREDNISONE 10 MG PO TABS: ORAL_TABLET | ORAL | Status: DC

## 2013-01-24 MED ORDER — CEFUROXIME AXETIL 500 MG PO TABS: 500.0000 mg | ORAL_TABLET | Freq: Two times a day (BID) | ORAL | Status: DC

## 2013-01-24 NOTE — Discharge Summary (Signed)
Physician Discharge Summary  Teresa Benson ZOX:[id number] DOB: 1950-11-09 DOA: 01/22/2013  PCP: [name], MD  Admit date: 01/22/2013 Discharge date: 01/24/2013  Time spent: 50* minutes  Recommendations for Outpatient Follow-up:  1. Follow up ENT as outpatient 2. Follow up PCP as outpatient  Discharge Diagnoses:  Principal Problem:   Cough Active Problems:   Chronic sinusitis   PND (post-nasal drip)   Dizziness   Discharge Condition: Stable  Diet recommendation:  Low salt diet  Filed Weights   01/22/13 1847 01/23/13 0343  Weight: 65.318 kg (144 lb) 65.6 kg (144 lb 10 oz)    History of present illness:  62 y.o. female history significant for chronic cough, chronic sinusitis (also seeing ENT for this), tachycardia, who presents with c/o nonproductive cough worse over the past 5 days, nausea, light headedness and dizziness. Has seen pulmonologist in past for chronic cough with negative workup, normal PFTs, no formal diagnosis of COPD nor asthma. No fevers nor chills, not really short of breath except when coughing. Does have significant dizziness. In past has been given steroid tapers which work until she ends the steroid taper and her cough comes back.  In the ED, patient was given breathing treatments, prednisone 60mg  x1, when they got the patient up to ambulate her, her O2 sat reportedly dropped to 88% on RA (uses no O2 at home and has 100% O2 sats at rest).   Hospital Course:  Acute bronchitis with wheezing worsened with acute flare/sinusitis  - Placed on Xopenex and Atrovent nebs, continue flonase  - patient has improved with above treatment - Placed on Ceftin, Tussionex  - Advised the patient to make a followup appointment with pulmonology next week  Will send her on Prednisone taper and ceftin  Active Problems:  Chronic sinusitis- continue Ceftin, Claritin, Flonase spray  Hypertension- stable  Dizziness- resolved Hy[pokalemia- resolved, potassium was  replaced   Procedures: none Consultations:  none  Discharge Exam: Filed Vitals:   01/23/13 1600 01/23/13 2057 01/24/13 0546 01/24/13 1017  BP:  127/78 129/80 113/69  Pulse:  104 92   Temp:  99 F (37.2 C) 98.4 F (36.9 C)   TempSrc:  Oral Oral   Resp:  18 20   Height:      Weight:      SpO2: 96% 96% 94%     General: appear in no acute distress Cardiovascular: s1s2 RRR Respiratory: Clear bilaterally  Discharge Instructions     Medication List    STOP taking these medications       Flutter Devi      TAKE these medications       albuterol 108 (90 BASE) MCG/ACT inhaler  Commonly known as:  PROVENTIL HFA;VENTOLIN HFA  Inhale 2 puffs into the lungs every 6 (six) hours as needed for wheezing.     albuterol (5 MG/ML) 0.5% nebulizer solution  Commonly known as:  PROVENTIL  Take 0.5 mLs (2.5 mg total) by nebulization every 6 (six) hours as needed for wheezing.     amLODipine 5 MG tablet  Commonly known as:  NORVASC  Take 5 mg by mouth daily.     bisoprolol 10 MG tablet  Commonly known as:  ZEBETA  Take 1 tablet (10 mg total) by mouth daily.     cefUROXime 500 MG tablet  Commonly known as:  CEFTIN  Take 1 tablet (500 mg total) by mouth 2 (two) times daily with a meal.     dextromethorphan 30 MG/5ML liquid  Commonly known  as:  DELSYM  Take 60 mg by mouth 2 (two) times daily as needed for cough.     DULoxetine 60 MG capsule  Commonly known as:  CYMBALTA  Take 60 mg by mouth daily.     famotidine 20 MG tablet  Commonly known as:  PEPCID  Take 20 mg by mouth at bedtime.     hydrochlorothiazide 25 MG tablet  Commonly known as:  HYDRODIURIL  Take 12.5 mg by mouth daily.     ipratropium 0.02 % nebulizer solution  Commonly known as:  ATROVENT  Take 2.5 mLs (0.5 mg total) by nebulization every 6 (six) hours as needed for wheezing.     loratadine 10 MG tablet  Commonly known as:  CLARITIN  Take 1 tablet (10 mg total) by mouth daily.     omeprazole 20 MG  tablet  Commonly known as:  PRILOSEC OTC  Take 1 tablet (20 mg total) by mouth 2 (two) times daily before a meal.     ONE-A-DAY WOMENS FORMULA PO  Take 1 tablet by mouth daily.     predniSONE 10 MG tablet  Commonly known as:  DELTASONE  Prednisone 40 mg po daily x 1 day then  Prednisone 30 mg po daily x 1 day then  Prednisone 20 mg po daily x 1 day then  Prednisone 10 mg daily x 1 day then stop...     zolpidem 6.25 MG CR tablet  Commonly known as:  AMBIEN CR  Take 6.25 mg by mouth at bedtime as needed for sleep.          The results of significant diagnostics from this hospitalization (including imaging, microbiology, ancillary and laboratory) are listed below for reference.    Significant Diagnostic Studies: Dg Chest 2 View  01/22/2013  *RADIOLOGY REPORT*  Clinical Data: Cough and congestion  CHEST - 2 VIEW  Comparison: 12/17/2012  Findings: Lumbar fusion hardware partly visualized.  Heart size is normal.  The lungs are clear.  No pleural effusion.  Leftward curvature of the thoracic spine again noted.  IMPRESSION: No acute cardiopulmonary process.   Original Report Authenticated By: Christiana Pellant, M.D.    Dg Abd 1 View  01/22/2013  *RADIOLOGY REPORT*  Clinical Data: Epigastric pain.  ABDOMEN - 1 VIEW  Comparison: Lumbar radiographs dated 04/20/2011  Findings: The bowel gas pattern is normal with air scattered throughout the nondistended colon.  No dilated small bowel. Stomach is not distended.  No acute osseous abnormality.  No visible free air free fluid. Previous lower lumbar fusion.  IMPRESSION: No acute abnormality.   Original Report Authenticated By: Francene Boyers, M.D.     Microbiology: Recent Results (from the past 240 hour(s))  URINE CULTURE     Status: None   Collection Time    01/22/13 10:38 PM      Result Value Range Status   Specimen Description URINE, CLEAN CATCH   Final   Special Requests NONE   Final   Culture  Setup Time 01/23/2013 00:36   Final    Colony Count 35,000 COLONIES/ML   Final   Culture     Final   Value: Multiple bacterial morphotypes present, none predominant. Suggest appropriate recollection if clinically indicated.   Report Status 01/24/2013 FINAL   Final     Labs: Basic Metabolic Panel:  Recent Labs Lab 01/22/13 2238 01/23/13 0312 01/24/13 0930  NA 140 136 139  K 4.0 3.1* 4.5  CL 99 100 104  CO2 27 23 24  GLUCOSE 113* 188* 159*  BUN 12 12 29*  CREATININE 1.35* 1.17* 1.27*  CALCIUM 10.3 9.2 9.4   Liver Function Tests:  Recent Labs Lab 01/22/13 2238  AST 35  ALT 16  ALKPHOS 139*  BILITOT 0.3  PROT 8.4*  ALBUMIN 3.8    Recent Labs Lab 01/22/13 2238  LIPASE 22   No results found for this basename: AMMONIA,  in the last 168 hours CBC:  Recent Labs Lab 01/22/13 1851  WBC 11.2*  NEUTROABS 7.9*  HGB 13.7  HCT 38.2  MCV 88.2  PLT 310   Cardiac Enzymes:  Recent Labs Lab 01/22/13 1851  TROPONINI <0.30   BNP: BNP (last 3 results)  Recent Labs  01/22/13 1851  PROBNP 154.9*   CBG: No results found for this basename: GLUCAP,  in the last 168 hours     Signed:  Sharicka Pogorzelski S  [facility] 01/24/2013, 11:00 AM

## 2013-01-24 NOTE — Progress Notes (Signed)
Teresa Benson to be D/C'd Home per MD order.  Discussed with the patient and all questions fully answered.    Medication List    STOP taking these medications       Flutter Devi      TAKE these medications       albuterol 108 (90 BASE) MCG/ACT inhaler  Commonly known as:  PROVENTIL HFA;VENTOLIN HFA  Inhale 2 puffs into the lungs every 6 (six) hours as needed for wheezing.     albuterol (5 MG/ML) 0.5% nebulizer solution  Commonly known as:  PROVENTIL  Take 0.5 mLs (2.5 mg total) by nebulization every 6 (six) hours as needed for wheezing.     amLODipine 5 MG tablet  Commonly known as:  NORVASC  Take 5 mg by mouth daily.     bisoprolol 10 MG tablet  Commonly known as:  ZEBETA  Take 1 tablet (10 mg total) by mouth daily.     cefUROXime 250 MG tablet  Commonly known as:  CEFTIN  Take 1 tablet (250 mg total) by mouth 2 (two) times daily with a meal.     dextromethorphan 30 MG/5ML liquid  Commonly known as:  DELSYM  Take 60 mg by mouth 2 (two) times daily as needed for cough.     DULoxetine 60 MG capsule  Commonly known as:  CYMBALTA  Take 60 mg by mouth daily.     famotidine 20 MG tablet  Commonly known as:  PEPCID  Take 20 mg by mouth at bedtime.     fluticasone 50 MCG/ACT nasal spray  Commonly known as:  FLONASE  Place 2 sprays into the nose daily.     hydrochlorothiazide 25 MG tablet  Commonly known as:  HYDRODIURIL  Take 12.5 mg by mouth daily.     ipratropium 0.02 % nebulizer solution  Commonly known as:  ATROVENT  Take 2.5 mLs (0.5 mg total) by nebulization every 6 (six) hours as needed for wheezing.     loratadine 10 MG tablet  Commonly known as:  CLARITIN  Take 1 tablet (10 mg total) by mouth daily.     omeprazole 20 MG tablet  Commonly known as:  PRILOSEC OTC  Take 1 tablet (20 mg total) by mouth 2 (two) times daily before a meal.     ONE-A-DAY WOMENS FORMULA PO  Take 1 tablet by mouth daily.     predniSONE 10 MG tablet  Commonly known as:   DELTASONE  Prednisone 40 mg po daily x 1 day then  Prednisone 30 mg po daily x 1 day then  Prednisone 20 mg po daily x 1 day then  Prednisone 10 mg daily x 1 day then stop...     zolpidem 6.25 MG CR tablet  Commonly known as:  AMBIEN CR  Take 6.25 mg by mouth at bedtime as needed for sleep.        VVS, Skin clean, dry and intact without evidence of skin break down, no evidence of skin tears noted. IV catheter discontinued intact. Site without signs and symptoms of complications. Dressing and pressure applied.  An After Visit Summary was printed and given to the patient. Follow up appointments , new prescriptions and medication administration times given. Gave pt handouts and education on bronchitis, GERD, hypokalemia, Potassium rich foods and sinusitis.  Patient escorted via WC, and D/C home via private auto.  Cindra Eves, RN 01/24/2013 1:20 PM

## 2013-01-24 NOTE — Progress Notes (Signed)
Pt complaining of a headache. On call NP notified, orders for tylenol given. Will continue to monitor.

## 2013-01-24 NOTE — Progress Notes (Deleted)
Pt complaining of a headache. On call NP notified, orders for tylenol given. Will continue to monitor.     

## 2013-01-24 NOTE — ED Provider Notes (Signed)
Medical screening examination/treatment/procedure(s) were conducted as a shared visit with non-physician practitioner(s) and myself.  I personally evaluated the patient during the encounter. Please see my previous note.  Clell Trahan M Ashmi Blas, DO 01/24/13 0740 

## 2013-02-20 ENCOUNTER — Ambulatory Visit: Payer: Medicare Other | Admitting: Internal Medicine

## 2013-03-02 ENCOUNTER — Emergency Department (HOSPITAL_COMMUNITY): Payer: Medicare Other

## 2013-03-02 ENCOUNTER — Encounter (HOSPITAL_COMMUNITY): Payer: Self-pay

## 2013-03-02 ENCOUNTER — Inpatient Hospital Stay (HOSPITAL_COMMUNITY)
Admission: EM | Admit: 2013-03-02 | Discharge: 2013-03-04 | DRG: 203 | Disposition: A | Discharge status: Home / Self Care | Payer: Medicare Other | Attending: Internal Medicine | Admitting: Internal Medicine

## 2013-03-02 DIAGNOSIS — M545 Low back pain, unspecified: Secondary | ICD-10-CM

## 2013-03-02 DIAGNOSIS — R059 Cough, unspecified: Secondary | ICD-10-CM

## 2013-03-02 DIAGNOSIS — IMO0001 Reserved for inherently not codable concepts without codable children: Secondary | ICD-10-CM

## 2013-03-02 DIAGNOSIS — R0602 Shortness of breath: Secondary | ICD-10-CM | POA: Diagnosis present

## 2013-03-02 DIAGNOSIS — R0609 Other forms of dyspnea: Secondary | ICD-10-CM

## 2013-03-02 DIAGNOSIS — R0600 Dyspnea, unspecified: Secondary | ICD-10-CM

## 2013-03-02 DIAGNOSIS — I10 Essential (primary) hypertension: Secondary | ICD-10-CM

## 2013-03-02 DIAGNOSIS — F329 Major depressive disorder, single episode, unspecified: Secondary | ICD-10-CM

## 2013-03-02 DIAGNOSIS — R0982 Postnasal drip: Secondary | ICD-10-CM

## 2013-03-02 DIAGNOSIS — R05 Cough: Secondary | ICD-10-CM

## 2013-03-02 DIAGNOSIS — I700 Atherosclerosis of aorta: Secondary | ICD-10-CM | POA: Diagnosis present

## 2013-03-02 DIAGNOSIS — J9801 Acute bronchospasm: Principal | ICD-10-CM | POA: Diagnosis present

## 2013-03-02 DIAGNOSIS — I517 Cardiomegaly: Secondary | ICD-10-CM | POA: Diagnosis present

## 2013-03-02 DIAGNOSIS — F32A Depression, unspecified: Secondary | ICD-10-CM

## 2013-03-02 DIAGNOSIS — E785 Hyperlipidemia, unspecified: Secondary | ICD-10-CM

## 2013-03-02 DIAGNOSIS — R000 Tachycardia, unspecified: Secondary | ICD-10-CM

## 2013-03-02 DIAGNOSIS — I498 Other specified cardiac arrhythmias: Secondary | ICD-10-CM | POA: Diagnosis present

## 2013-03-02 DIAGNOSIS — J329 Chronic sinusitis, unspecified: Secondary | ICD-10-CM

## 2013-03-02 DIAGNOSIS — R42 Dizziness and giddiness: Secondary | ICD-10-CM

## 2013-03-02 LAB — URINE MICROSCOPIC-ADD ON

## 2013-03-02 LAB — BASIC METABOLIC PANEL
BUN: 25 mg/dL — ABNORMAL HIGH (ref 6–23)
CO2: 17 mEq/L — ABNORMAL LOW (ref 19–32)
Calcium: 7.2 mg/dL — ABNORMAL LOW (ref 8.4–10.5)
Creatinine, Ser: 3.4 mg/dL — ABNORMAL HIGH (ref 0.50–1.10)
GFR calc non Af Amer: 13 mL/min — ABNORMAL LOW (ref >90)
Glucose, Bld: 94 mg/dL (ref 70–99)

## 2013-03-02 LAB — CBC WITH DIFFERENTIAL/PLATELET
Eosinophils Absolute: 0.1 10*3/uL (ref 0.0–0.7)
Eosinophils Relative: 2 % (ref 0–5)
HCT: 29.2 % — ABNORMAL LOW (ref 36.0–46.0)
Hemoglobin: 9.8 g/dL — ABNORMAL LOW (ref 12.0–15.0)
Lymphocytes Relative: 19 % (ref 12–46)
Lymphs Abs: 1.1 10*3/uL (ref 0.7–4.0)
MCH: 27.1 pg (ref 26.0–34.0)
MCV: 80.7 fL (ref 78.0–100.0)
Monocytes Absolute: 0.6 10*3/uL (ref 0.1–1.0)
Monocytes Relative: 9 % (ref 3–12)
Platelets: 269 10*3/uL (ref 150–400)
RBC: 3.62 MIL/uL — ABNORMAL LOW (ref 3.87–5.11)

## 2013-03-02 LAB — URINALYSIS, ROUTINE W REFLEX MICROSCOPIC
Bilirubin Urine: NEGATIVE
Glucose, UA: NEGATIVE mg/dL
Hgb urine dipstick: NEGATIVE
Protein, ur: NEGATIVE mg/dL
Urobilinogen, UA: 0.2 mg/dL (ref 0.0–1.0)

## 2013-03-02 LAB — CBC
HCT: 36.4 % (ref 36.0–46.0)
MCH: 31 pg (ref 26.0–34.0)
MCV: 91 fL (ref 78.0–100.0)
Platelets: 254 10*3/uL (ref 150–400)
RBC: 4 MIL/uL (ref 3.87–5.11)
RDW: 14.1 % (ref 11.5–15.5)
WBC: 16.1 10*3/uL — ABNORMAL HIGH (ref 4.0–10.5)

## 2013-03-02 LAB — COMPREHENSIVE METABOLIC PANEL
AST: 38 U/L — ABNORMAL HIGH (ref 0–37)
BUN: 19 mg/dL (ref 6–23)
CO2: 26 mEq/L (ref 19–32)
Calcium: 9.9 mg/dL (ref 8.4–10.5)
Chloride: 100 mEq/L (ref 96–112)
Creatinine, Ser: 1.25 mg/dL — ABNORMAL HIGH (ref 0.50–1.10)
GFR calc non Af Amer: 45 mL/min — ABNORMAL LOW (ref >90)
Total Bilirubin: 0.2 mg/dL — ABNORMAL LOW (ref 0.3–1.2)

## 2013-03-02 MED ORDER — ONDANSETRON HCL 4 MG/2ML IJ SOLN: 4.0000 mg | Freq: Four times a day (QID) | INTRAMUSCULAR | Status: DC | PRN

## 2013-03-02 MED ORDER — ENOXAPARIN SODIUM 40 MG/0.4ML ~~LOC~~ SOLN
40.0000 mg | SUBCUTANEOUS | Status: DC
  Administered 2013-03-02 – 2013-03-04 (×3): 40 mg via SUBCUTANEOUS
  Filled 2013-03-02 (×3): qty 0.4

## 2013-03-02 MED ORDER — FUROSEMIDE 10 MG/ML IJ SOLN
20.0000 mg | Freq: Once | INTRAMUSCULAR | Status: AC
  Administered 2013-03-02: 20 mg via INTRAVENOUS
  Filled 2013-03-02: qty 2

## 2013-03-02 MED ORDER — ALBUTEROL SULFATE (5 MG/ML) 0.5% IN NEBU
5.0000 mg | INHALATION_SOLUTION | Freq: Once | RESPIRATORY_TRACT | Status: AC
  Administered 2013-03-02: 5 mg via RESPIRATORY_TRACT
  Filled 2013-03-02: qty 1

## 2013-03-02 MED ORDER — ACETAMINOPHEN 325 MG PO TABS: 650.0000 mg | ORAL_TABLET | Freq: Four times a day (QID) | ORAL | Status: DC | PRN

## 2013-03-02 MED ORDER — METHYLPREDNISOLONE SODIUM SUCC 125 MG IJ SOLR
80.0000 mg | Freq: Two times a day (BID) | INTRAMUSCULAR | Status: DC
  Filled 2013-03-02: qty 1.28

## 2013-03-02 MED ORDER — LORATADINE 10 MG PO TABS
10.0000 mg | ORAL_TABLET | Freq: Every day | ORAL | Status: DC
  Administered 2013-03-02 – 2013-03-04 (×3): 10 mg via ORAL
  Filled 2013-03-02 (×3): qty 1

## 2013-03-02 MED ORDER — ALBUTEROL SULFATE (5 MG/ML) 0.5% IN NEBU
5.0000 mg | INHALATION_SOLUTION | Freq: Once | RESPIRATORY_TRACT | Status: AC
  Administered 2013-03-02: 5 mg via RESPIRATORY_TRACT

## 2013-03-02 MED ORDER — ZOLPIDEM TARTRATE 5 MG PO TABS
5.0000 mg | ORAL_TABLET | Freq: Every evening | ORAL | Status: DC | PRN
  Administered 2013-03-02: 5 mg via ORAL
  Filled 2013-03-02: qty 1

## 2013-03-02 MED ORDER — ACETAMINOPHEN 650 MG RE SUPP: 650.0000 mg | Freq: Four times a day (QID) | RECTAL | Status: DC | PRN

## 2013-03-02 MED ORDER — ALBUTEROL SULFATE (5 MG/ML) 0.5% IN NEBU
INHALATION_SOLUTION | RESPIRATORY_TRACT | Status: AC
  Filled 2013-03-02: qty 1

## 2013-03-02 MED ORDER — METHYLPREDNISOLONE SODIUM SUCC 125 MG IJ SOLR
125.0000 mg | Freq: Once | INTRAMUSCULAR | Status: AC
  Administered 2013-03-02: 125 mg via INTRAVENOUS
  Filled 2013-03-02: qty 2

## 2013-03-02 MED ORDER — ONDANSETRON HCL 4 MG PO TABS: 4.0000 mg | ORAL_TABLET | Freq: Four times a day (QID) | ORAL | Status: DC | PRN

## 2013-03-02 MED ORDER — HYDROMORPHONE HCL PF 1 MG/ML IJ SOLN: 0.5000 mg | INTRAMUSCULAR | Status: DC | PRN

## 2013-03-02 MED ORDER — METHYLPREDNISOLONE SODIUM SUCC 125 MG IJ SOLR
60.0000 mg | Freq: Two times a day (BID) | INTRAMUSCULAR | Status: DC
  Administered 2013-03-02 – 2013-03-04 (×4): 60 mg via INTRAVENOUS
  Filled 2013-03-02 (×6): qty 0.96

## 2013-03-02 MED ORDER — ALBUTEROL SULFATE (5 MG/ML) 0.5% IN NEBU
2.5000 mg | INHALATION_SOLUTION | Freq: Four times a day (QID) | RESPIRATORY_TRACT | Status: DC
  Administered 2013-03-02 – 2013-03-04 (×8): 2.5 mg via RESPIRATORY_TRACT
  Filled 2013-03-02 (×4): qty 0.5
  Filled 2013-03-02: qty 1
  Filled 2013-03-02 (×4): qty 0.5

## 2013-03-02 MED ORDER — METHYLPREDNISOLONE SODIUM SUCC 125 MG IJ SOLR
125.0000 mg | Freq: Four times a day (QID) | INTRAMUSCULAR | Status: DC
  Filled 2013-03-02 (×3): qty 2

## 2013-03-02 MED ORDER — SODIUM CHLORIDE 0.9 % IV SOLN: INTRAVENOUS | Status: DC

## 2013-03-02 MED ORDER — LEVOFLOXACIN 500 MG PO TABS
500.0000 mg | ORAL_TABLET | Freq: Every day | ORAL | Status: DC
  Administered 2013-03-02 – 2013-03-04 (×3): 500 mg via ORAL
  Filled 2013-03-02 (×3): qty 1

## 2013-03-02 MED ORDER — ALUM & MAG HYDROXIDE-SIMETH 200-200-20 MG/5ML PO SUSP
30.0000 mL | Freq: Four times a day (QID) | ORAL | Status: DC | PRN
  Administered 2013-03-04: 30 mL via ORAL
  Filled 2013-03-02 (×2): qty 30

## 2013-03-02 MED ORDER — OXYCODONE HCL 5 MG PO TABS
5.0000 mg | ORAL_TABLET | ORAL | Status: DC | PRN
  Administered 2013-03-02 – 2013-03-04 (×7): 5 mg via ORAL
  Filled 2013-03-02 (×7): qty 1

## 2013-03-02 MED ORDER — IPRATROPIUM BROMIDE 0.02 % IN SOLN
0.5000 mg | Freq: Once | RESPIRATORY_TRACT | Status: AC
  Administered 2013-03-02: 0.5 mg via RESPIRATORY_TRACT
  Filled 2013-03-02: qty 2.5

## 2013-03-02 MED ORDER — ALBUTEROL SULFATE (5 MG/ML) 0.5% IN NEBU
2.5000 mg | INHALATION_SOLUTION | RESPIRATORY_TRACT | Status: DC | PRN
  Administered 2013-03-02: 2.5 mg via RESPIRATORY_TRACT

## 2013-03-02 MED ORDER — BENZONATATE 100 MG PO CAPS
100.0000 mg | ORAL_CAPSULE | Freq: Three times a day (TID) | ORAL | Status: DC | PRN
  Administered 2013-03-03 (×2): 100 mg via ORAL
  Filled 2013-03-02 (×2): qty 1

## 2013-03-02 NOTE — ED Notes (Signed)
Attempted to call report

## 2013-03-02 NOTE — Progress Notes (Addendum)
[facility]                                                                                Patient Demographics  Teresa Benson, is a 62 y.o. female, DOB - [birth year], AVW:[id number], [id number]:[id number]  Admit date - 03/02/2013  Admitting Physician [name], MD  Outpatient Primary MD for the patient is [name], MD  LOS - 0   Chief Complaint  Patient presents with  . Shortness of Breath        Assessment & Plan    1. Acute Bronchospasm- No official Diagnosis of RAD,or Asthma or COPD. Had workup by [facility] Pulmonologist Dr. Sherene Sires.She also has a chronic cough for the last several months for which he is following with Dr. Sherene Sires, she's feeling better on empiric Levaquin for likely URI along with IV steroid which will be tapered, currently not requiring oxygen, nebulizer treatments as needed.Will also check an echogram To rule out cardiac component to this chronic cough and shortness of breathalong with sputum cultures,   2. SOB due to #1. As above.     3. Tachycardia-  Stable TSH, likely due to #1 along with nebulizer treatment effect.    4. HTN- Continue HCTZ, and Amlodipine. PRN IV hydralazine.     5. Dyslipidemia- outpt followup  Lab Results  Component Value Date   CHOL 138 05/16/2012   HDL 57.90 05/16/2012   LDLCALC 62 05/16/2012   LDLDIRECT 84.3 05/10/2011   TRIG 89.0 05/16/2012   CHOLHDL 2 05/16/2012       Code Status: Full  Family Communication: none present  Disposition Plan: Home   Procedures CXR, Echo   Consults      DVT Prophylaxis  Lovenox    Lab Results  Component Value Date   PLT 254 03/02/2013    Medications  Scheduled Meds: . albuterol  2.5 mg Nebulization Q6H  . enoxaparin (LOVENOX) injection  40 mg Subcutaneous Q24H  . furosemide  20 mg Intravenous Once  . levofloxacin  500 mg Oral Daily  . loratadine  10 mg Oral Daily  . methylPREDNISolone (SOLU-MEDROL) injection  60 mg Intravenous Q12H   Continuous Infusions: .  sodium chloride     PRN Meds:.acetaminophen, acetaminophen, albuterol, alum & mag hydroxide-simeth, HYDROmorphone (DILAUDID) injection, ondansetron (ZOFRAN) IV, ondansetron, oxyCODONE, zolpidem  Antibiotics     Anti-infectives   Start     Dose/Rate Route Frequency Ordered Stop   03/02/13 1000  levofloxacin (LEVAQUIN) tablet 500 mg     500 mg Oral Daily 03/02/13 0619 03/09/13 0959       Time Spent in minutes   35   Yanixan Mellinger K M.D on 03/02/2013 at 1:13 PM  Between 7am to 7pm - Pager - [phone]  After 7pm go to [email or web address] - password [email or web address]  And look for the night coverage person covering for me after hours  [facility] Office  [phone]    Subjective:   Teresa Benson today has, No headache, No chest pain, No abdominal pain - No Nausea, No new weakness tingling or numbness, ++ Cough - No SOB.    Objective:   Filed Vitals:   03/02/13 0630 03/02/13 0740 03/02/13 2725  03/02/13 1130  BP: 113/56 128/61 133/70   Pulse: 105 110 111   Temp:  98.2 F (36.8 C) 98.5 F (36.9 C)   TempSrc:      Resp:  18 18   Height:  5\' 1"  (1.549 m)    Weight:  67.405 kg (148 lb 9.6 oz)    SpO2: 100% 100% 96% 100%    Wt Readings from Last 3 Encounters:  03/02/13 67.405 kg (148 lb 9.6 oz)  01/23/13 65.6 kg (144 lb 10 oz)  12/12/12 66.316 kg (146 lb 3.2 oz)     Intake/Output Summary (Last 24 hours) at 03/02/13 1313 Last data filed at 03/02/13 0900  Gross per 24 hour  Intake    240 ml  Output      0 ml  Net    240 ml    Exam Awake Alert, Oriented X 3, No new F.N deficits, Normal affect [place].AT,PERRAL Supple Neck,No JVD, No cervical lymphadenopathy appriciated.  Symmetrical Chest wall movement, Good air movement bilaterally, few wheezes RRR,No Gallops,Rubs or new Murmurs, No Parasternal Heave +ve B.Sounds, Abd Soft, Non tender, No organomegaly appriciated, No rebound - guarding or rigidity. No Cyanosis, Clubbing or edema, No new Rash or bruise      Data  Review   Micro Results No results found for this or any previous visit (from the past 240 hour(s)).  Radiology Reports Dg Chest 2 View  03/02/2013   *RADIOLOGY REPORT*  Clinical Data: Chest tightness and shortness of breath.  CHEST - 2 VIEW  Comparison: Chest radiograph performed 01/22/2013  Findings: The lungs are well-aerated.  Mild peribronchial thickening is noted.  There is no evidence of focal opacification, pleural effusion or pneumothorax.  The heart is normal in size; the mediastinal contour is within normal limits.  No acute osseous abnormalities are seen.  IMPRESSION: Mild peribronchial thickening noted; no evidence of focal airspace consolidation.   Original Report Authenticated By: Tonia Ghent, M.D.    CBC  Recent Labs Lab 03/02/13 0230 03/02/13 0351  WBC 6.1 16.1*  HGB 9.8* 12.4  HCT 29.2* 36.4  PLT 269 254  MCV 80.7 91.0  MCH 27.1 31.0  MCHC 33.6 34.1  RDW 14.6 14.1  LYMPHSABS 1.1  --   MONOABS 0.6  --   EOSABS 0.1  --   BASOSABS 0.0  --     Chemistries   Recent Labs Lab 03/02/13 0230 03/02/13 0351  NA 135 137  K 4.4 3.9  CL 108 100  CO2 17* 26  GLUCOSE 94 144*  BUN 25* 19  CREATININE 3.40* 1.25*  CALCIUM 7.2* 9.9  AST  --  38*  ALT  --  23  ALKPHOS  --  163*  BILITOT  --  0.2*   ------------------------------------------------------------------------------------------------------------------ estimated creatinine clearance is 41 ml/min (by C-G formula based on Cr of 1.25). ------------------------------------------------------------------------------------------------------------------ No results found for this basename: HGBA1C,  in the last 72 hours ------------------------------------------------------------------------------------------------------------------ No results found for this basename: CHOL, HDL, LDLCALC, TRIG, CHOLHDL, LDLDIRECT,  in the last 72  hours ------------------------------------------------------------------------------------------------------------------ No results found for this basename: TSH, T4TOTAL, FREET3, T3FREE, THYROIDAB,  in the last 72 hours ------------------------------------------------------------------------------------------------------------------ No results found for this basename: VITAMINB12, FOLATE, FERRITIN, TIBC, IRON, RETICCTPCT,  in the last 72 hours  Coagulation profile No results found for this basename: INR, PROTIME,  in the last 168 hours  No results found for this basename: DDIMER,  in the last 72 hours  Cardiac Enzymes No results found for this basename:  CK, CKMB, TROPONINI, MYOGLOBIN,  in the last 168 hours ------------------------------------------------------------------------------------------------------------------ No components found with this basename: POCBNP,

## 2013-03-02 NOTE — ED Notes (Signed)
Patient is resting comfortably. 

## 2013-03-02 NOTE — ED Notes (Signed)
Pt's room assignment being changed from 6N-05 to 6N-01.  Change due to pt being changed from Med/Surg to Tele.

## 2013-03-02 NOTE — ED Provider Notes (Signed)
History     CSN: [id number]  Arrival date & time 03/02/13  0131   First MD Initiated Contact with Patient 03/02/13 [phone]      Chief Complaint  Patient presents with  . Shortness of Breath    HPI Patient reports a history of persistent cough and shortness of breath has been an issue with her over the past 2 years.  She's seen primary care, ENT, pulmonary and now it has a clear understanding of why she has this persistent cough the wire time she gets short of breath.  Is supple motor function testing shows COPD.  She reports worsening shortness of breath over the past 24 hours and she tried her albuterol at home without improvement in her symptoms.  She denies fevers and chills.  She reports her cough seems to worsen some.  No unilateral leg swelling.  History of DVT or pulmonary embolism.  She is on a PPI.  She is not on lisinopril.  Her symptoms are mild to moderate in severity at this time.  Nothing worsens or improves her symptoms   Past Medical History  Diagnosis Date  . Lower back pain   . HTN (hypertension)   . Dyslipidemia   . Mild aortic sclerosis   . Tachycardia   . Normal nuclear stress test July 2010  . H/O: GI bleed   . Depression   . Chronic sinusitis     Past Surgical History  Procedure Laterality Date  . Tubal ligation    . Abdominal hysterectomy    . Back surgery      Family History  Problem Relation Age of Onset  . Stroke Father   . Heart disease Mother     CABG  . Heart attack Mother   . Asthma Grandchild     History  Substance Use Topics  . Smoking status: Never Smoker   . Smokeless tobacco: Never Used     Comment: Lived with a smoker for at least 20 years  . Alcohol Use: No    OB History   Grav Para Term Preterm Abortions TAB SAB Ect Mult Living                  Review of Systems  All other systems reviewed and are negative.    Allergies  Review of patient's allergies indicates no known allergies.  Home Medications   Current  Outpatient Rx  Name  Route  Sig  Dispense  Refill  . albuterol (PROVENTIL HFA;VENTOLIN HFA) 108 (90 BASE) MCG/ACT inhaler   Inhalation   Inhale 2 puffs into the lungs every 6 (six) hours as needed for wheezing.   1 Inhaler   2   . albuterol (PROVENTIL) (5 MG/ML) 0.5% nebulizer solution   Nebulization   Take 0.5 mLs (2.5 mg total) by nebulization every 6 (six) hours as needed for wheezing.   20 mL   2   . amLODipine (NORVASC) 5 MG tablet   Oral   Take 5 mg by mouth daily.          . bisoprolol (ZEBETA) 10 MG tablet   Oral   Take 1 tablet (10 mg total) by mouth daily.   60 tablet   11   . DULoxetine (CYMBALTA) 60 MG capsule   Oral   Take 60 mg by mouth daily.         . famotidine (PEPCID) 20 MG tablet   Oral   Take 20 mg by mouth at bedtime.          Marland Kitchen  fluticasone (FLONASE) 50 MCG/ACT nasal spray   Nasal   Place 2 sprays into the nose daily.   16 g   0   . guaiFENesin (ROBITUSSIN) 100 MG/5ML liquid   Oral   Take 200 mg by mouth every 4 (four) hours as needed for cough.         . hydrochlorothiazide (HYDRODIURIL) 25 MG tablet   Oral   Take 12.5 mg by mouth daily.          Marland Kitchen ipratropium (ATROVENT) 0.02 % nebulizer solution   Nebulization   Take 2.5 mLs (0.5 mg total) by nebulization every 6 (six) hours as needed for wheezing.   75 mL   2   . loratadine (CLARITIN) 10 MG tablet   Oral   Take 1 tablet (10 mg total) by mouth daily.   30 tablet   2   . Multiple Vitamins-Calcium (ONE-A-DAY WOMENS FORMULA PO)   Oral   Take 1 tablet by mouth daily.           Marland Kitchen omeprazole (PRILOSEC OTC) 20 MG tablet   Oral   Take 1 tablet (20 mg total) by mouth 2 (two) times daily before a meal.   60 tablet   11   . zolpidem (AMBIEN CR) 6.25 MG CR tablet   Oral   Take 6.25 mg by mouth at bedtime as needed for sleep.            BP 144/93  Pulse 118  Temp(Src) 98.9 F (37.2 C) (Oral)  Resp 16  SpO2 97%  Physical Exam  Nursing note and vitals  reviewed. Constitutional: She is oriented to person, place, and time. She appears well-developed and well-nourished. No distress.  HENT:  Head: Normocephalic and atraumatic.  Eyes: EOM are normal.  Neck: Normal range of motion.  Cardiovascular: Normal rate, regular rhythm and normal heart sounds.   Pulmonary/Chest: Effort normal. No respiratory distress. She has wheezes.  Abdominal: Soft. She exhibits no distension. There is no tenderness.  Musculoskeletal: Normal range of motion.  Neurological: She is alert and oriented to person, place, and time.  Skin: Skin is warm and dry.  Psychiatric: She has a normal mood and affect. Judgment normal.    ED Course  Procedures (including critical care time)   Date: 03/02/2013  Rate: 109  Rhythm: Sinus tach  QRS Axis: normal  Intervals: normal  ST/T Wave abnormalities: normal  Conduction Disutrbances: none  Narrative Interpretation:   Old EKG Reviewed: No significant changes noted     Dg Chest 2 View  03/02/2013   *RADIOLOGY REPORT*  Clinical Data: Chest tightness and shortness of breath.  CHEST - 2 VIEW  Comparison: Chest radiograph performed 01/22/2013  Findings: The lungs are well-aerated.  Mild peribronchial thickening is noted.  There is no evidence of focal opacification, pleural effusion or pneumothorax.  The heart is normal in size; the mediastinal contour is within normal limits.  No acute osseous abnormalities are seen.  IMPRESSION: Mild peribronchial thickening noted; no evidence of focal airspace consolidation.   Original Report Authenticated By: Tonia Ghent, M.D.   I personally reviewed the imaging tests through PACS system I reviewed available ER/hospitalization records through the EMR   1. Dyspnea       MDM  Patient feels much better after albuterol and Atrovent as well as Solu-Medrol.  Will continue to observe this patient emergency department.  Chest x-ray pending.     3:51 AM I was called by the lab manager he  reports that the blood tests obtained may be false and thus they will be repeated.  He thought that this may have been an issue with labels.  5:06 AM Patient states her breathing is improved there is now returned to baseline at this time she does not feel like she got home safely given the degree of her shortness of breath.  The patient be admitted for ongoing dyspnea.  This is likely more of a bronchitis/bronchiolitis     Lyanne Co, MD 03/02/13 [phone]

## 2013-03-02 NOTE — ED Notes (Signed)
Old and New EKG given to Dr Patria Mane. Pt is given a urine cup and she is aware she needs a urine sample.

## 2013-03-02 NOTE — ED Notes (Signed)
Pt c/o nonproductive cough and inspiratory and expiratory wheezing

## 2013-03-02 NOTE — H&P (Signed)
[facility] History and Physical  [name] NUU:[id number] DOB: [birth year] DOA: 03/02/2013  Referring physician: EDP PCP: [name], MD  Specialists:   Chief Complaint:  SOB and Wheezing  HPI: Teresa Benson is a 62 y.o. female presenting with complaints of increased wheezing, chest tightness, and SOB x 1 day.  She reports only mild relief when she took her Proventil inhaler.  She denies having any fevers chills or cough.  She also denies chest pain.   She was seen in the ED and evaluated and administered continuous nebulizer treatments but had only minimal improvement.  She was administered IV Soulmedrol and was referred for medical admission.     She reports having an extensive pulmonary workup in the past for her recurring symptoms by [facility] Pulmonologist Dr. Sherene Sires.   She reports that she was not found to have COPD or Asthma with testing.      Review of Systems: The patient denies anorexia, fever, chills, headaches, weight loss vision loss, decreased hearing, hoarseness, chest pain, syncope, dyspnea on exertion, peripheral edema, balance deficits, hemoptysis, abdominal pain, nausea, vomiting, diarrhea, constipation, hematemesis, melena, hematochezia, severe indigestion/heartburn, hematuria, incontinence, dysuria, muscle weakness, suspicious skin lesions, transient blindness, difficulty walking, depression, unusual weight change, abnormal bleeding, enlarged lymph nodes, angioedema, and breast masses.    Past Medical History  Diagnosis Date  . Lower back pain   . HTN (hypertension)   . Dyslipidemia   . Mild aortic sclerosis   . Tachycardia   . Normal nuclear stress test July 2010  . H/O: GI bleed   . Depression   . Chronic sinusitis    Past Surgical History  Procedure Laterality Date  . Tubal ligation    . Abdominal hysterectomy    . Back surgery      Medications:  HOME MEDS: Prior to Admission medications   Medication Sig Start Date End Date Taking? Authorizing  Provider  albuterol (PROVENTIL HFA;VENTOLIN HFA) 108 (90 BASE) MCG/ACT inhaler Inhale 2 puffs into the lungs every 6 (six) hours as needed for wheezing. 12/18/12  Yes Heather Zenaida Niece Wingen, PA-C  albuterol (PROVENTIL) (5 MG/ML) 0.5% nebulizer solution Take 0.5 mLs (2.5 mg total) by nebulization every 6 (six) hours as needed for wheezing. 01/24/13  Yes Meredeth Ide, MD  amLODipine (NORVASC) 5 MG tablet Take 5 mg by mouth daily.  11/05/11  Yes Historical Provider, MD  bisoprolol (ZEBETA) 10 MG tablet Take 1 tablet (10 mg total) by mouth daily. 07/10/12  Yes Nyoka Cowden, MD  DULoxetine (CYMBALTA) 60 MG capsule Take 60 mg by mouth daily.   Yes Historical Provider, MD  famotidine (PEPCID) 20 MG tablet Take 20 mg by mouth at bedtime.  08/14/12  Yes Nyoka Cowden, MD  fluticasone (FLONASE) 50 MCG/ACT nasal spray Place 2 sprays into the nose daily. 01/24/13  Yes Meredeth Ide, MD  guaiFENesin (ROBITUSSIN) 100 MG/5ML liquid Take 200 mg by mouth every 4 (four) hours as needed for cough.   Yes Historical Provider, MD  hydrochlorothiazide (HYDRODIURIL) 25 MG tablet Take 12.5 mg by mouth daily.    Yes Historical Provider, MD  ipratropium (ATROVENT) 0.02 % nebulizer solution Take 2.5 mLs (0.5 mg total) by nebulization every 6 (six) hours as needed for wheezing. 01/24/13  Yes Meredeth Ide, MD  loratadine (CLARITIN) 10 MG tablet Take 1 tablet (10 mg total) by mouth daily. 01/24/13  Yes Meredeth Ide, MD  Multiple Vitamins-Calcium (ONE-A-DAY WOMENS FORMULA PO) Take 1 tablet by mouth daily.  Yes Historical Provider, MD  omeprazole (PRILOSEC OTC) 20 MG tablet Take 1 tablet (20 mg total) by mouth 2 (two) times daily before a meal. 08/14/12  Yes Nyoka Cowden, MD  zolpidem (AMBIEN CR) 6.25 MG CR tablet Take 6.25 mg by mouth at bedtime as needed for sleep.  10/16/12  Yes [name], MD    Allergies:  No Known Allergies   Social History:   reports that she has never smoked. She has never used smokeless tobacco. She  reports that she does not drink alcohol or use illicit drugs.   Family History: Family History  Problem Relation Age of Onset  . Stroke Father   . Heart disease Mother     CABG  . Heart attack Mother   . Asthma Grandchild     Physical Exam:  GEN:  Pleasant  62 year old well nourished and well developed African American Female examined  and in no acute distress; cooperative with exam Filed Vitals:   03/02/13 0226 03/02/13 0330 03/02/13 0345 03/02/13 0400  BP:  144/70 155/83 135/55  Pulse:  111 106 114  Temp:      TempSrc:      Resp:      SpO2: 97% 100% 100% 100%   Blood pressure 135/55, pulse 114, temperature 98.9 F (37.2 C), temperature source Oral, resp. rate 16, SpO2 100.00%. PSYCH: She is alert and oriented x4; does not appear anxious does not appear depressed; affect is normal HEENT: Normocephalic and Atraumatic, Mucous membranes pink; PERRLA; EOM intact; Fundi:  Benign;  No scleral icterus, Nares: Patent, Oropharynx: Clear, Edentulous top palate, Fair dentition on bottom, Neck:  FROM, no cervical lymphadenopathy nor thyromegaly or carotid bruit; no JVD; Breasts:: Not examined CHEST WALL: No tenderness CHEST: Normal respiration, diffuse Expiratory Wheezes, No Rhonchi, No Rales HEART: Regular rate and rhythm; no murmurs rubs or gallops BACK: No kyphosis or scoliosis; no CVA tenderness ABDOMEN: Positive Bowel Sounds, soft non-tender; no masses, no organomegaly. Rectal Exam: Not done EXTREMITIES: No cyanosis, clubbing or edema; no ulcerations. Genitalia: not examined PULSES: 2+ and symmetric SKIN: Normal hydration no rash or ulceration CNS: Cranial nerves 2-12 grossly intact no focal neurologic deficit   Labs & Imaging Results for orders placed during the hospital encounter of 03/02/13 (from the past 48 hour(s))  CBC WITH DIFFERENTIAL     Status: Abnormal   Collection Time    03/02/13  2:30 AM      Result Value Range   WBC 6.1  4.0 - 10.5 K/uL   RBC 3.62 (*) 3.87 -  5.11 MIL/uL   Hemoglobin 9.8 (*) 12.0 - 15.0 g/dL   HCT 78.2 (*) 95.6 - 21.3 %   MCV 80.7  78.0 - 100.0 fL   MCH 27.1  26.0 - 34.0 pg   MCHC 33.6  30.0 - 36.0 g/dL   RDW 08.6  57.8 - 46.9 %   Platelets 269  150 - 400 K/uL   Neutrophils Relative % 70  43 - 77 %   Neutro Abs 4.3  1.7 - 7.7 K/uL   Lymphocytes Relative 19  12 - 46 %   Lymphs Abs 1.1  0.7 - 4.0 K/uL   Monocytes Relative 9  3 - 12 %   Monocytes Absolute 0.6  0.1 - 1.0 K/uL   Eosinophils Relative 2  0 - 5 %   Eosinophils Absolute 0.1  0.0 - 0.7 K/uL   Basophils Relative 0  0 - 1 %   Basophils  Absolute 0.0  0.0 - 0.1 K/uL  BASIC METABOLIC PANEL     Status: Abnormal   Collection Time    03/02/13  2:30 AM      Result Value Range   Sodium 135  135 - 145 mEq/L   Comment: PATIENT IDENTIFICATION ERROR. PLEASE DISREGARD RESULTS. ACCOUNT WILL BE CREDITED.     NOTIFIED DR. Orvan Falconer 03/02/2013 0345 [place]   Potassium 4.4  3.5 - 5.1 mEq/L   Comment: PATIENT IDENTIFICATION ERROR. PLEASE DISREGARD RESULTS. ACCOUNT WILL BE CREDITED.     NOTIFIED DR. Orvan Falconer 03/02/2013 0345 [place]   Chloride 108  96 - 112 mEq/L   Comment: PATIENT IDENTIFICATION ERROR. PLEASE DISREGARD RESULTS. ACCOUNT WILL BE CREDITED.     NOTIFIED DR. Orvan Falconer 03/02/2013 0345 [place]   CO2 17 (*) 19 - 32 mEq/L   Comment: PATIENT IDENTIFICATION ERROR. PLEASE DISREGARD RESULTS. ACCOUNT WILL BE CREDITED.     NOTIFIED DR. Orvan Falconer 03/02/2013 0345 [place]   Glucose, Bld 94  70 - 99 mg/dL   Comment: PATIENT IDENTIFICATION ERROR. PLEASE DISREGARD RESULTS. ACCOUNT WILL BE CREDITED.     NOTIFIED DR. Orvan Falconer 03/02/2013 0345 [place]   BUN 25 (*) 6 - 23 mg/dL   Comment: PATIENT IDENTIFICATION ERROR. PLEASE DISREGARD RESULTS. ACCOUNT WILL BE CREDITED.     NOTIFIED DR. Orvan Falconer 03/02/2013 0345 [place]   Creatinine, Ser 3.40 (*) 0.50 - 1.10 mg/dL   Comment: PATIENT IDENTIFICATION ERROR. PLEASE DISREGARD  RESULTS. ACCOUNT WILL BE CREDITED.     NOTIFIED DR. Orvan Falconer 03/02/2013 0345 [place]   Calcium 7.2 (*) 8.4 - 10.5 mg/dL   Comment: PATIENT IDENTIFICATION ERROR. PLEASE DISREGARD RESULTS. ACCOUNT WILL BE CREDITED.     NOTIFIED DR. Orvan Falconer 03/02/2013 0345 [place]   GFR calc non Af Amer 13 (*) >90 mL/min   Comment: PATIENT IDENTIFICATION ERROR. PLEASE DISREGARD RESULTS. ACCOUNT WILL BE CREDITED.     NOTIFIED DR. Orvan Falconer 03/02/2013 0345 [place]   GFR calc Af Amer 16 (*) >90 mL/min   Comment:            The eGFR has been calculated     using the CKD EPI equation.     This calculation has not been     validated in all clinical     situations.     eGFR's persistently     <90 mL/min signify     possible Chronic Kidney Disease.     PATIENT IDENTIFICATION ERROR. PLEASE DISREGARD RESULTS. ACCOUNT WILL BE CREDITED.     NOTIFIED DR. Orvan Falconer 03/02/2013 0345 [place]  CBC     Status: Abnormal   Collection Time    03/02/13  3:51 AM      Result Value Range   WBC 16.1 (*) 4.0 - 10.5 K/uL   RBC 4.00  3.87 - 5.11 MIL/uL   Hemoglobin 12.4  12.0 - 15.0 g/dL   HCT 96.0  45.4 - 09.8 %   MCV 91.0  78.0 - 100.0 fL   MCH 31.0  26.0 - 34.0 pg   MCHC 34.1  30.0 - 36.0 g/dL   RDW 11.9  14.7 - 82.9 %   Platelets 254  150 - 400 K/uL  COMPREHENSIVE METABOLIC PANEL     Status: Abnormal   Collection Time    03/02/13  3:51 AM      Result Value Range   Sodium 137  135 - 145 mEq/L   Potassium 3.9  3.5 - 5.1 mEq/L   Comment: HEMOLYSIS AT THIS LEVEL MAY AFFECT RESULT  Chloride 100  96 - 112 mEq/L   CO2 26  19 - 32 mEq/L   Glucose, Bld 144 (*) 70 - 99 mg/dL   BUN 19  6 - 23 mg/dL   Creatinine, Ser 1.61 (*) 0.50 - 1.10 mg/dL   Comment: DELTA CHECK NOTED   Calcium 9.9  8.4 - 10.5 mg/dL   Total Protein 7.5  6.0 - 8.3 g/dL   Albumin 3.5  3.5 - 5.2 g/dL   AST 38 (*) 0 - 37 U/L   ALT 23  0 - 35 U/L   Alkaline Phosphatase 163 (*) 39 - 117 U/L   Total Bilirubin  0.2 (*) 0.3 - 1.2 mg/dL   GFR calc non Af Amer 45 (*) >90 mL/min   GFR calc Af Amer 52 (*) >90 mL/min   Comment:            The eGFR has been calculated     using the CKD EPI equation.     This calculation has not been     validated in all clinical     situations.     eGFR's persistently     <90 mL/min signify     possible Chronic Kidney Disease.  URINALYSIS, ROUTINE W REFLEX MICROSCOPIC     Status: Abnormal   Collection Time    03/02/13  5:09 AM      Result Value Range   Color, Urine YELLOW  YELLOW   APPearance CLOUDY (*) CLEAR   Specific Gravity, Urine 1.009  1.005 - 1.030   pH 5.5  5.0 - 8.0   Glucose, UA NEGATIVE  NEGATIVE mg/dL   Hgb urine dipstick NEGATIVE  NEGATIVE   Bilirubin Urine NEGATIVE  NEGATIVE   Ketones, ur NEGATIVE  NEGATIVE mg/dL   Protein, ur NEGATIVE  NEGATIVE mg/dL   Urobilinogen, UA 0.2  0.0 - 1.0 mg/dL   Nitrite NEGATIVE  NEGATIVE   Leukocytes, UA LARGE (*) NEGATIVE  URINE MICROSCOPIC-ADD ON     Status: Abnormal   Collection Time    03/02/13  5:09 AM      Result Value Range   Squamous Epithelial / LPF RARE  RARE   WBC, UA 7-10  <3 WBC/hpf   RBC / HPF 0-2  <3 RBC/hpf   Bacteria, UA FEW (*) RARE    Radiological Exams on Admission: Dg Chest 2 View  03/02/2013   *RADIOLOGY REPORT*  Clinical Data: Chest tightness and shortness of breath.  CHEST - 2 VIEW  Comparison: Chest radiograph performed 01/22/2013  Findings: The lungs are well-aerated.  Mild peribronchial thickening is noted.  There is no evidence of focal opacification, pleural effusion or pneumothorax.  The heart is normal in size; the mediastinal contour is within normal limits.  No acute osseous abnormalities are seen.  IMPRESSION: Mild peribronchial thickening noted; no evidence of focal airspace consolidation.   Original Report Authenticated By: Tonia Ghent, M.D.    EKG: Independently reviewed. Sinus Tachycardia at rate of 109, No acute S-T changes.      Assessment/Plan Active Problems:    Acute bronchospasm   SOB (shortness of breath)   Tachycardia   HTN (hypertension)   Dyslipidemia   Chronic sinusitis   1.  Acute Bronchospasm-  No official Diagnosis of RAD,or Asthma or COPD.  Had workup by [facility] Pulmonologist Dr. Sherene Sires. Initiated high dose Steroid taper and albuterol nebs and O2 PRN.   Consider [facility].     2.  SOB due to #1.  O2 PRN,  Bronchodilator Rx.    3.  Tachycardia- check TSH, acutely worsened by albuterol nebs.     4.   HTN-  Continue HCTZ, and Amlodipine.  PRN IV hydralazine.    5.  Dyslipidemia-  Check Fasting lipids    Code Status:  FULL CODE Family Communication:  Daughter and Son-in-Law at Bedside Disposition Plan:  Return to Home on dischaarge  Time spent: 54 Minutes  [name] [facility] Pager [phone]  If 7PM-7AM, please contact night-coverage [email or web address] Password [facility] 03/02/2013, 6:24 AM

## 2013-03-03 DIAGNOSIS — R0989 Other specified symptoms and signs involving the circulatory and respiratory systems: Secondary | ICD-10-CM

## 2013-03-03 DIAGNOSIS — R0609 Other forms of dyspnea: Secondary | ICD-10-CM

## 2013-03-03 LAB — BASIC METABOLIC PANEL
BUN: 27 mg/dL — ABNORMAL HIGH (ref 6–23)
CO2: 26 mEq/L (ref 19–32)
Calcium: 9.7 mg/dL (ref 8.4–10.5)
Chloride: 100 mEq/L (ref 96–112)
Creatinine, Ser: 1.2 mg/dL — ABNORMAL HIGH (ref 0.50–1.10)
GFR calc Af Amer: 55 mL/min — ABNORMAL LOW (ref >90)
GFR calc non Af Amer: 47 mL/min — ABNORMAL LOW (ref >90)
Glucose, Bld: 156 mg/dL — ABNORMAL HIGH (ref 70–99)
Potassium: 4.2 mEq/L (ref 3.5–5.1)
Sodium: 137 mEq/L (ref 135–145)

## 2013-03-03 LAB — CBC
Hemoglobin: 11.1 g/dL — ABNORMAL LOW (ref 12.0–15.0)
MCH: 30.7 pg (ref 26.0–34.0)
MCV: 91.7 fL (ref 78.0–100.0)
RBC: 3.61 MIL/uL — ABNORMAL LOW (ref 3.87–5.11)

## 2013-03-03 LAB — URINE CULTURE

## 2013-03-03 MED ORDER — IPRATROPIUM BROMIDE 0.02 % IN SOLN
0.5000 mg | Freq: Four times a day (QID) | RESPIRATORY_TRACT | Status: DC
  Administered 2013-03-03 – 2013-03-04 (×4): 0.5 mg via RESPIRATORY_TRACT
  Filled 2013-03-03 (×3): qty 2.5

## 2013-03-03 MED ORDER — FUROSEMIDE 40 MG PO TABS
40.0000 mg | ORAL_TABLET | Freq: Once | ORAL | Status: AC
  Administered 2013-03-03: 40 mg via ORAL
  Filled 2013-03-03: qty 1

## 2013-03-03 MED ORDER — MAGNESIUM SULFATE IN D5W 10-5 MG/ML-% IV SOLN
1.0000 g | Freq: Once | INTRAVENOUS | Status: AC
  Administered 2013-03-03: 1 g via INTRAVENOUS
  Filled 2013-03-03: qty 100

## 2013-03-03 MED ORDER — IPRATROPIUM BROMIDE 0.02 % IN SOLN
RESPIRATORY_TRACT | Status: AC
  Filled 2013-03-03: qty 2.5

## 2013-03-03 MED ORDER — LORAZEPAM 1 MG PO TABS
2.0000 mg | ORAL_TABLET | Freq: Once | ORAL | Status: AC
  Administered 2013-03-03: 2 mg via ORAL
  Filled 2013-03-03: qty 2

## 2013-03-03 NOTE — Progress Notes (Signed)
  Echocardiogram 2D Echocardiogram has been performed.  [name] 03/03/2013, 8:43 AM

## 2013-03-04 MED ORDER — BENZONATATE 100 MG PO CAPS: 100.0000 mg | ORAL_CAPSULE | Freq: Three times a day (TID) | ORAL | Status: DC | PRN

## 2013-03-04 MED ORDER — ALBUTEROL SULFATE (5 MG/ML) 0.5% IN NEBU
2.5000 mg | INHALATION_SOLUTION | Freq: Three times a day (TID) | RESPIRATORY_TRACT | Status: DC
  Administered 2013-03-04: 2.5 mg via RESPIRATORY_TRACT
  Filled 2013-03-04: qty 0.5

## 2013-03-04 MED ORDER — PREDNISONE 5 MG PO TABS: ORAL_TABLET | ORAL | Status: DC

## 2013-03-04 MED ORDER — FUROSEMIDE 20 MG PO TABS: 20.0000 mg | ORAL_TABLET | Freq: Two times a day (BID) | ORAL | Status: DC

## 2013-03-04 MED ORDER — IPRATROPIUM BROMIDE 0.02 % IN SOLN
0.5000 mg | Freq: Three times a day (TID) | RESPIRATORY_TRACT | Status: DC
  Administered 2013-03-04: 0.5 mg via RESPIRATORY_TRACT
  Filled 2013-03-04: qty 2.5

## 2013-03-04 MED ORDER — LEVOFLOXACIN 500 MG PO TABS: 500.0000 mg | ORAL_TABLET | Freq: Every day | ORAL | Status: DC

## 2013-03-04 NOTE — Progress Notes (Signed)
Discharge patient home, Home discharge instruction given, no questions verbalized.

## 2013-03-04 NOTE — Discharge Summary (Signed)
[facility]                                                                                   Teresa Benson, is a 62 y.o. female  DOB 1950-12-24  MRN [id number].  Admission date:  03/02/2013  Discharge Date:  03/04/2013  Primary MD  [name], MD  Admitting Physician  [name], MD  Admission Diagnosis  HTN (hypertension) [401.9] Dyspnea [786.09] Tachycardia [785.0] Dyslipidemia [272.4] Acute bronchospasm [519.11] Chronic sinusitis [473.9]  Discharge Diagnosis     Active Problems:   HTN (hypertension)   Dyslipidemia   Tachycardia   Chronic sinusitis   Acute bronchospasm   SOB (shortness of breath)    Past Medical History  Diagnosis Date  . Lower back pain   . HTN (hypertension)   . Dyslipidemia   . Mild aortic sclerosis   . Tachycardia   . Normal nuclear stress test July 2010  . H/O: GI bleed   . Depression   . Chronic sinusitis     Past Surgical History  Procedure Laterality Date  . Tubal ligation    . Abdominal hysterectomy    . Back surgery       Recommendations for primary care physician for things to follow:    follow CMP , lasix dose closely   Discharge Diagnoses:   Active Problems:   HTN (hypertension)   Dyslipidemia   Tachycardia   Chronic sinusitis   Acute bronchospasm   SOB (shortness of breath)    Discharge Condition: Stable   Diet recommendation: See Discharge Instructions below   Consults      History of present illness and  Hospital Course:     Kindly see H&P for history of present illness and admission details, please review complete Labs, Consult reports and Test reports for all details in brief Teresa Benson, is a 62 y.o. female, patient with history of hypertension, dyslipidemia, chronic sinusitis, recurrent cough and wheezing for the last year or so for which she's been following with Dr. Sherene Sires presented to the hospital with another episode of worsening of her baseline cough and wheezing along  with shortness of breath, she was treated with IV steroids along with minimal relief, she was afebrile, she also received an echogram which was unremarkable, however she showed considerable improvement up sure she was given few trial doses of Lasix, I question if she has some pulmonary congestion due to mild LVH not picked up on echogram. I will give her a steroid taper, finish her Levaquin dose with 4 more days of Levaquin, and I will place her on a low-dose Lasix along with fluid restriction, we'll request her primary care physician and cardiologist to kindly monitor her CMP and a close basis. I will also discontinue her Norvasc which could be causing some fluid retention. She is now symptom-free cough has almost resolved, freezing has almost resolved.   She has mildly elevated alkaline phosphatase for which outpatient followup with repeat CMP in a few days is recommended,      Today   Subjective:   Teresa Benson today has no headache,no chest abdominal pain,no new weakness tingling or numbness, feels much better  wants to go home today.  Much improved cough and wheezing.   Objective:   Blood pressure 124/71, pulse 100, temperature 97.9 F (36.6 C), temperature source Oral, resp. rate 16, height 5\' 1"  (1.549 m), weight 67.405 kg (148 lb 9.6 oz), SpO2 96.00%.   Intake/Output Summary (Last 24 hours) at 03/04/13 0933 Last data filed at 03/04/13 0926  Gross per 24 hour  Intake    420 ml  Output   1100 ml  Net   -680 ml    Exam Awake Alert, Oriented *3, No new F.N deficits, Normal affect [place].AT,PERRAL Supple Neck,No JVD, No cervical lymphadenopathy appriciated.  Symmetrical Chest wall movement, Good air movement bilaterally, few wheezes RRR,No Gallops,Rubs or new Murmurs, No Parasternal Heave +ve B.Sounds, Abd Soft, Non tender, No organomegaly appriciated, No rebound -guarding or rigidity. No Cyanosis, Clubbing or edema, No new Rash or bruise  Data Review   Major procedures and  Radiology Reports - PLEASE review detailed and final reports for all details in brief -       Dg Chest 2 View  03/02/2013   *RADIOLOGY REPORT*  Clinical Data: Chest tightness and shortness of breath.  CHEST - 2 VIEW  Comparison: Chest radiograph performed 01/22/2013  Findings: The lungs are well-aerated.  Mild peribronchial thickening is noted.  There is no evidence of focal opacification, pleural effusion or pneumothorax.  The heart is normal in size; the mediastinal contour is within normal limits.  No acute osseous abnormalities are seen.  IMPRESSION: Mild peribronchial thickening noted; no evidence of focal airspace consolidation.   Original Report Authenticated By: Tonia Ghent, M.D.    Micro Results      Recent Results (from the past 240 hour(s))  URINE CULTURE     Status: None   Collection Time    03/02/13  5:09 AM      Result Value Range Status   Specimen Description URINE, CLEAN CATCH   Final   Special Requests CX ADDED AT [id number]   Final   Culture  Setup Time 03/02/2013 05:50   Final   Colony Count 30,000 COLONIES/ML   Final   Culture     Final   Value: Multiple bacterial morphotypes present, none predominant. Suggest appropriate recollection if clinically indicated.   Report Status 03/03/2013 FINAL   Final     CBC w Diff: Lab Results  Component Value Date   WBC 15.8* 03/03/2013   HGB 11.1* 03/03/2013   HCT 33.1* 03/03/2013   PLT 211 03/03/2013   LYMPHOPCT 19 03/02/2013   MONOPCT 9 03/02/2013   EOSPCT 2 03/02/2013   BASOPCT 0 03/02/2013    CMP: Lab Results  Component Value Date   NA 137 03/03/2013   K 4.2 03/03/2013   CL 100 03/03/2013   CO2 26 03/03/2013   BUN 27* 03/03/2013   BUN 22* 05/26/2010   CREATININE 1.20* 03/03/2013   GLU 114 05/26/2010   PROT 7.5 03/02/2013   ALBUMIN 3.5 03/02/2013   BILITOT 0.2* 03/02/2013   ALKPHOS 163* 03/02/2013   AST 38* 03/02/2013   ALT 23 03/02/2013  .   Discharge Instructions     Follow with Primary MD [name], MD or Wert  in 3 days   Get CBC, CMP, checked 3 days by Primary MD and again as instructed by your Primary MD. Get a 2 view Chest X ray done next visit.  Get Medicines reviewed and adjusted.  Please request your Prim.MD to go over all Hospital Tests and Procedure/Radiological  results at the follow up, please get all Hospital records sent to your Prim MD by signing hospital release before you go home.  Activity: As tolerated with Full fall precautions use walker/cane & assistance as needed   Diet:  Heart Healthy,  Fluid restriction 1.5 lit/day, Aspiration precautions.  Check your Weight same time everyday, if you gain over 2 pounds, or you develop in leg swelling, experience more shortness of breath or chest pain, call your Primary MD immediately. Follow Cardiac Low Salt Diet and 1.5 lit/day fluid restriction.  Disposition Home  If you experience worsening of your admission symptoms, develop shortness of breath, life threatening emergency, suicidal or homicidal thoughts you must seek medical attention immediately by calling 911 or calling your MD immediately  if symptoms less severe.  You Must read complete instructions/literature along with all the possible adverse reactions/side effects for all the Medicines you take and that have been prescribed to you. Take any new Medicines after you have completely understood and accpet all the possible adverse reactions/side effects.   Do not drive and provide baby sitting services if your were admitted for syncope or siezures until you have seen by Primary MD or a Neurologist and advised to do so again.  Do not drive when taking Pain medications.    Do not take more than prescribed Pain, Sleep and Anxiety Medications  Special Instructions: If you have smoked or chewed Tobacco  in the last 2 yrs please stop smoking, stop any regular Alcohol  and or any Recreational drug use.  Wear Seat belts while driving.       Follow-up Information   Follow up with  [name], MD. Schedule an appointment as soon as possible for a visit in 3 days. (and Dr wert)    [facility] information:   [address] [place] [address] [phone]       Follow up with Sandrea Hughs, MD. Schedule an appointment as soon as possible for a visit in 3 days.   Contact information:   [address] [place] [address] [phone]         Discharge Medications     Medication List    STOP taking these medications       amLODipine 5 MG tablet  Commonly known as:  NORVASC     guaiFENesin 100 MG/5ML liquid  Commonly known as:  ROBITUSSIN     hydrochlorothiazide 25 MG tablet  Commonly known as:  HYDRODIURIL      TAKE these medications       albuterol 108 (90 BASE) MCG/ACT inhaler  Commonly known as:  PROVENTIL HFA;VENTOLIN HFA  Inhale 2 puffs into the lungs every 6 (six) hours as needed for wheezing.     albuterol (5 MG/ML) 0.5% nebulizer solution  Commonly known as:  PROVENTIL  Take 0.5 mLs (2.5 mg total) by nebulization every 6 (six) hours as needed for wheezing.     benzonatate 100 MG capsule  Commonly known as:  TESSALON  Take 1 capsule (100 mg total) by mouth 3 (three) times daily as needed for cough.     bisoprolol 10 MG tablet  Commonly known as:  ZEBETA  Take 1 tablet (10 mg total) by mouth daily.     DULoxetine 60 MG capsule  Commonly known as:  CYMBALTA  Take 60 mg by mouth daily.     famotidine 20 MG tablet  Commonly known as:  PEPCID  Take 20 mg by mouth at bedtime.  fluticasone 50 MCG/ACT nasal spray  Commonly known as:  FLONASE  Place 2 sprays into the nose daily.     furosemide 20 MG tablet  Commonly known as:  LASIX  Take 1 tablet (20 mg total) by mouth 2 (two) times daily.     ipratropium 0.02 % nebulizer solution  Commonly known as:  ATROVENT  Take 2.5 mLs (0.5 mg total) by nebulization every 6 (six) hours as needed for wheezing.     levofloxacin 500 MG tablet  Commonly known as:   LEVAQUIN  Take 1 tablet (500 mg total) by mouth daily.     loratadine 10 MG tablet  Commonly known as:  CLARITIN  Take 1 tablet (10 mg total) by mouth daily.     omeprazole 20 MG tablet  Commonly known as:  PRILOSEC OTC  Take 1 tablet (20 mg total) by mouth 2 (two) times daily before a meal.     ONE-A-DAY WOMENS FORMULA PO  Take 1 tablet by mouth daily.     predniSONE 5 MG tablet  Commonly known as:  DELTASONE  Label  & dispense according to the schedule below. 10 Pills PO for 3 days then, 8 Pills PO for 3 days, 6 Pills PO for 3 days, 4 Pills PO for 3 days, 2 Pills PO for 3 days, 1 Pills PO for 3 days, 1/2 Pill  PO for 3 days then STOP. Total 95 pills.     zolpidem 6.25 MG CR tablet  Commonly known as:  AMBIEN CR  Take 6.25 mg by mouth at bedtime as needed for sleep.           Total Time in preparing paper work, data evaluation and todays exam - 35 minutes  Leroy Sea M.D on 03/04/2013 at 9:33 AM  [facility] Office  [phone]

## 2013-03-05 ENCOUNTER — Ambulatory Visit (INDEPENDENT_AMBULATORY_CARE_PROVIDER_SITE_OTHER)
Admission: RE | Admit: 2013-03-05 | Discharge: 2013-03-05 | Disposition: A | Discharge status: Home / Self Care | Payer: Medicare Other | Source: Ambulatory Visit | Attending: Internal Medicine | Admitting: Internal Medicine

## 2013-03-05 ENCOUNTER — Encounter: Payer: Self-pay | Admitting: Internal Medicine

## 2013-03-05 ENCOUNTER — Other Ambulatory Visit (INDEPENDENT_AMBULATORY_CARE_PROVIDER_SITE_OTHER): Payer: Medicare Other

## 2013-03-05 ENCOUNTER — Telehealth: Payer: Self-pay | Admitting: Internal Medicine

## 2013-03-05 ENCOUNTER — Ambulatory Visit (INDEPENDENT_AMBULATORY_CARE_PROVIDER_SITE_OTHER): Payer: Medicare Other | Admitting: Internal Medicine

## 2013-03-05 VITALS — BP 122/80 | HR 102 | Temp 98.7°F | Ht 60.25 in | Wt 149.0 lb

## 2013-03-05 DIAGNOSIS — R05 Cough: Secondary | ICD-10-CM

## 2013-03-05 DIAGNOSIS — R059 Cough, unspecified: Secondary | ICD-10-CM

## 2013-03-05 DIAGNOSIS — R0602 Shortness of breath: Secondary | ICD-10-CM

## 2013-03-05 DIAGNOSIS — I10 Essential (primary) hypertension: Secondary | ICD-10-CM

## 2013-03-05 DIAGNOSIS — N189 Chronic kidney disease, unspecified: Secondary | ICD-10-CM | POA: Insufficient documentation

## 2013-03-05 DIAGNOSIS — N289 Disorder of kidney and ureter, unspecified: Secondary | ICD-10-CM

## 2013-03-05 LAB — BASIC METABOLIC PANEL
Calcium: 8.9 mg/dL (ref 8.4–10.5)
GFR: 40.44 mL/min — ABNORMAL LOW (ref >60.00)
Glucose, Bld: 185 mg/dL — ABNORMAL HIGH (ref 70–99)
Sodium: 137 mEq/L (ref 135–145)

## 2013-03-05 LAB — CBC WITH DIFFERENTIAL/PLATELET
Basophils Absolute: 0 10*3/uL (ref 0.0–0.1)
Eosinophils Relative: 0.1 % (ref 0.0–5.0)
HCT: 35.6 % — ABNORMAL LOW (ref 36.0–46.0)
Lymphs Abs: 1.3 10*3/uL (ref 0.7–4.0)
MCV: 90.6 fl (ref 78.0–100.0)
Monocytes Absolute: 0.3 10*3/uL (ref 0.1–1.0)
Platelets: 241 10*3/uL (ref 150.0–400.0)
RDW: 14.9 % — ABNORMAL HIGH (ref 11.5–14.6)

## 2013-03-05 MED ORDER — TRAMADOL HCL 50 MG PO TABS: ORAL_TABLET | ORAL | Status: DC

## 2013-03-05 NOTE — Assessment & Plan Note (Signed)
Spirometry wnl 03/05/2013 on effort dep portion of f/v loop  Still strongly favor pseudoasthma but need to re-eval after steroid taper to off- in meantime continues to struggle with med rec;    Each maintenance medication was reviewed in detail including most importantly the difference between maintenance and as needed and under what circumstances the prns are to be used. This was done in the context of a medication calendar review which provided the patient with a user-friendly unambiguous mechanism for medication administration and reconciliation and provides an action plan for all active problems. It is critical that this be shown to every doctor  for modification during the office visit if necessary so the patient can use it as a working document.

## 2013-03-05 NOTE — Addendum Note (Signed)
Addended by: Sandrea Hughs B on: 03/05/2013 09:11 PM   Modules accepted: Level of Service

## 2013-03-05 NOTE — Assessment & Plan Note (Signed)
Change to bystolic 09/16/2011 due to concern ? Cough variant asthma Changed bystolic to lopressor in high doses for tachycardia 11/2011 - Recurrent severe cough so changed from lopressor 100 bid to bisoprolol 10 mg bid 07/11/2012  - Trial off losartan started 08/15/2012   Try off lasix as maint due to creat up to 1.7

## 2013-03-05 NOTE — Assessment & Plan Note (Signed)
Lab Results  Component Value Date   CREATININE 1.7* 03/05/2013   CREATININE 1.20* 03/03/2013   CREATININE 1.25* 03/02/2013     Much worse with diuresis which I'm not sure she really needs > change lasix to prn swelling  instead of bid and recheck in 1 week

## 2013-03-05 NOTE — Progress Notes (Signed)
Subjective:    Patient ID: Teresa Benson, female    DOB: 06-30-1951    MRN: [id number]  Brief patient profile:  38 yobf never smoker with pattern of recurrent bronchitis esp in winter with one severe enough to crack ribs around 2000 but in between no cough or sob or need for any inhalers self-referred 09/15/2011 for persistent cough since mid October 2012   09/15/2011 1st pulmonary office eval/ Marcin Holte on coreg  cc acute mostly dry onset cough with sensation of post pnds  mid Oct with nl cxr mid nov  [facility] pak  then LUQ pain x 2 days Prior to day of OV  On  prilosec q day before bfast with intermittent overt hb but no active sinus complaints and no h/o childhood allergy or asthma.  NO BETTER on day of ov.  rec Prilosec 20 mg Take x 2  30-60 min before first meal of the day  And Pepcid 20 mg one at bedtime GERD   Stop corevidol Bystolic 10 mg one twice daily Take mucinex dm two every 12 hours and supplement if needed with  tramadol 50 mg up to 2 every 4 hours  Once you have eliminated the cough for 3 straight days try reducing the tramadol first,  then the delsym as tolerated.   Prednisone 10 mg take  4 each am x 2 days,   2 each am x 2 days,  1 each am x2days and stop  11/2011 admitted [name] x one week no dx but ended up off bystolic and on lopressor 100 bid due to tachycardia apparently   12/25/2011 f/u ov/Haset [place] back on high dose lopressor twice daily  cc cough back for 2 weeks mostly dry, has trouble in the allergy season with lots of itching sneezing but not typically coughing. Using albuterol sev times a week was told to consider neb albuterol ? By same doctor who rec high dose lopressor? Not sure. [place].  No sob unless coughing rec Singulair 10 mg one daily until you return Stay on prilosec x 2 x 30 min before first meal and pepcid 20 mg at bedtime Prednisone 10 mg take  4 each am x 2 days,   2 each am x 2 days,  1 each am x2days  and stop  If start bad cough, first start using the flutter valve Take delsym two tsp every 12 hours and supplement if needed with  tramadol 50 mg up to 2 every 4 hours F/u 4 weeks > did not return   07/10/2012 f/u ov/Jossie Smoot not on singulair/ did stay  on prilosec 20mg  bid ac and pepcid one at bedtime does not know dose but continued to have severe cough "every few months". Present Cough x 7 days typical  honking dry sound, not using flutter, no better with proventil which ran out 9/29 >>changed lopressor to bisoprolol , added singulair, tramadol/delsym for cough , pred taper off  07/16/2012 Follow up and med calendar  Patient returns for a one-week followup for cough. She is brought all of her medications for a medication review. We reviewed all her medications and organized them into a medication calendar with patient education. Last visit. Patient was changed from Lopressor to bisoprolol. However, patient misunderstood instructions and took both prescriptions. She was also started on Singulair along with tramadol and Delsym for cough. She was given a prednisone taper. Overall she feels much improved. Her cough is approximately 75% better.  She does continue to get some coughing fits. Cough  mainly dry in nature. rec Stop Metoprolol  Continue on Bisoprolol daily  Follow med calendar closely and bring to each visit.   08/14/2012 f/u ov/Kalan Rinn using calendar well cc cough back the same, and really only better while on tramadol and even then still had urge to clear throat but no excess mucus or sob. rec Stop singulair and losartan Double your hydrodiuril to 25 mg total daily Prednisone 10 mg take  4 each am x 2 days,   2 each am x 2 days,  1 each am x2days and stop  Take delsym two tsp every 12 hours and supplement if needed with  tramadol 50 mg up to 2 every 4 hours to suppress the urge to cough.  For drainage, take chlortrimeton 4 mg every 6 hours as needed to see if it helps GERD  diet   08/29/2012 f/u ov/Rion Catala  using calendar but didn't bring it, no longer needs cough meds or h1 at all with no sign cough or sob.   rec Follow calendar  11/20/2012 acute w/u in ov /Reon Hunley did not bring calendar  But "use it every day" cc worse cough and sob x 10 days, nonproductive , coughs to point of loosing her breath. gen ant chest discomfort brought on my coughing.  Cough more day than night. >>Pred taper , CT sinus >  12/12/2012 Follow up and med review  Returns for 3 week follow up and med review. Continues to have daily cough . tx with steroid taper last ov with some help but cough never went away and as soon as steroids done cough worsens each day.  Taking delsym and tramadol for cough control w/ some help.  We reviewed all her meds and organized them into a med calendar w/ pt education . Appears she is taking correctly.   Underwent CT sinus on 2/17 that showed  Complete opacification visualized aspect of the left  frontal sinus.  Moderate mucosal thickening / opacification ethmoid sinus air cells bilaterally. Opacification/mucosal thickening maxillary sinuses greater on the right.  > referred to ent / [name]/ rec consider surgery and did not f/u  Admission date: 03/02/2013  Discharge Date: 03/04/2013  Primary MD [name], MD  Admitting Physician [name], MD  Admission Diagnosis HTN (hypertension) [401.9]  Dyspnea [786.09]  Tachycardia [785.0]  Dyslipidemia [272.4]  Acute bronchospasm [519.11]  Chronic sinusitis [473.9]    03/05/2013 p  hosp ov/Pallas Wahlert rx with lasix bid despite BNP only 155 Chief Complaint  Patient presents with  . HFU    Pt states that her breathing has improves, but not back at her normal baseline yet. No new co's.    cough is day = night dry not wet, now has neb for paroxyms of sob typically with cough.  No obvious daytime variabilty or   cp or chest tightness, subjective wheeze overt sinus or hb symptoms. No unusual exp hx or h/o  childhood pna/ asthma or premature birth to her knowledge.   Also denies any obvious fluctuation of symptoms with weather or environmental changes or other aggravating or alleviating factors except as outlined above.  Current Medications, Allergies, Past Medical History, Past Surgical History, Family History, and Social History were reviewed in [employer] record.  ROS  The following are not active complaints unless bolded sore throat, dysphagia, dental problems, itching, sneezing,  nasal congestion or excess/ purulent secretions, ear ache,   fever, chills, sweats, unintended  wt loss, pleuritic or exertional cp, hemoptysis,  orthopnea pnd or leg swelling, presyncope, palpitations, heartburn, abdominal pain, anorexia, nausea, vomiting, diarrhea  or change in bowel or urinary habits, change in stools or urine, dysuria,hematuria,  rash, arthralgias, visual complaints, headache, numbness weakness or ataxia or problems with walking or coordination,  change in mood/affect or memory.                      Objective:   Physical Exam  Edentulous bf nad mild nasal tone to voice  Wt 126  09/15/11 > 12/25/2011  128 > 07/10/2012  139>142 07/16/2012 > 08/14/2012 143> 08/29/2012  143> 11/20/2012  145 >146 12/12/2012 > 03/05/2013   149  HEENT: nl dentition, turbinates, and orophanx. Nl external ear canals without cough reflex   NECK :  without JVD/Nodes/TM/ nl carotid upstrokes bilaterally   LUNGS: no acc muscle use, clear to A and P bilaterally     CV:  RRR  no s3 or murmur or increase in P2, no edema   ABD:  soft and nontender with nl excursion in the supine position. No bruits or organomegaly, bowel sounds nl  MS:  warm without deformities, calf tenderness, cyanosis or clubbing          .      CXR  03/05/2013 :  No acute abnormalities.  03/05/13 spirometry s obstruction        Assessment & Plan:

## 2013-03-05 NOTE — Telephone Encounter (Signed)
Spoke with pt and appt with Dr Sherene Sires today with cxr first.

## 2013-03-05 NOTE — Assessment & Plan Note (Signed)
-   PFT's wnl 12/25/2011    - Trial of singulair maint rx 12/25/2011  > did not maintain, restart effective 07/11/2012 > no better 08/14/12 so d/c'd   - med calendar 07/16/2012 >    - Sinus CT 11/20/2012 > Complete opacification visualized aspect of the left frontal sinus. Mild mucosal thickening right frontal sinus.Moderate mucosal thickening / opacification ethmoid sinus air cells bilaterally. No evidence of intraorbital extension.Opacification/mucosal thickening maxillary sinuses greater on the right.Mild mucosal thickening sphenoid sinus air cells.Limited evaluation infundibulum.Portions of the sinus opacification with central hyperdense material which may represent inspissated proteinaceous material. 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   These are the same pts (now being labeled as having "irritable larynx syndrome" by some cough centers) who not infrequently have a history of having failed to tolerate ace inhibitors,  dry powder inhalers or biphosphonates or report having atypical reflux symptoms that don't respond to standard doses of PPI , and are easily confused as having aecopd or asthma flares by even experienced allergists/ pulmonologists.   Needs to have her sinus dz addressed and in meantime eliminate cyclical coughing with max gerd rx and tramadol

## 2013-03-05 NOTE — Patient Instructions (Addendum)
See calendar for specific medication instructions and bring it back for each and every office visit for every healthcare provider you see.  Without it,  you may not receive the best quality medical care that we feel you deserve.  You will note that the calendar groups together  your maintenance  medications that are timed at particular times of the day.  Think of this as your checklist for what your doctor has instructed you to do until your next evaluation to see what benefit  there is  to staying on a consistent group of medications intended to keep you well.  The other group at the bottom is entirely up to you to use as you see fit  for specific symptoms that may arise between visits that require you to treat them on an as needed basis.  Think of this as your action plan or "what if" list.   Separating the top medications from the bottom group is fundamental to providing you adequate care going forward.    Continue to taper the prednisone off  Take delsym two tsp every 12 hours and supplement if needed with  tramadol 50 mg up to 2 every 4 hours to suppress the urge to cough. Swallowing water or using ice chips/non mint and menthol containing candies (such as lifesavers or sugarless jolly ranchers) are also effective.  You should rest your voice and avoid activities that you know make you cough.  Once you have eliminated the cough for 3 straight days try reducing the tramadol first,  then the delsym as tolerated.    See Tammy NP w/in 1 weeks with all your medications, even over the counter meds, separated in two separate bags, the ones you take no matter what vs the ones you stop once you feel better and take only as needed when you feel you need them.   Tammy  will generate for you a new user friendly medication calendar that will put [place] all on the same page re: your medication use.     Without this process, it simply isn't possible to assure that we are providing  your outpatient care  with  the  attention to detail we feel you deserve.   If we cannot assure that you're getting that kind of care,  then we cannot manage your problem effectively from this clinic.  Once you have seen Tammy and we are sure that we're all on the same page with your medication use she will arrange follow up with me.   Also see make Appt to see [name] re possible sinus surgery. Late add : creat up to 1.7 so change lasix to prn and recheck one week

## 2013-03-06 NOTE — Progress Notes (Signed)
Quick Note:  Spoke with pt and notified of results per Dr. Wert. Pt verbalized understanding and denied any questions.  ______ 

## 2013-03-13 ENCOUNTER — Encounter: Payer: Self-pay | Admitting: Adult Health

## 2013-03-13 ENCOUNTER — Ambulatory Visit (INDEPENDENT_AMBULATORY_CARE_PROVIDER_SITE_OTHER): Payer: Medicare Other | Admitting: Adult Health

## 2013-03-13 ENCOUNTER — Other Ambulatory Visit: Payer: Medicare Other

## 2013-03-13 ENCOUNTER — Other Ambulatory Visit: Payer: Self-pay | Admitting: Internal Medicine

## 2013-03-13 VITALS — BP 122/80 | HR 81 | Temp 98.5°F | Ht 61.0 in | Wt 149.8 lb

## 2013-03-13 DIAGNOSIS — R059 Cough, unspecified: Secondary | ICD-10-CM

## 2013-03-13 DIAGNOSIS — R05 Cough: Secondary | ICD-10-CM

## 2013-03-13 MED ORDER — TRAMADOL HCL 50 MG PO TABS: ORAL_TABLET | ORAL | Status: DC

## 2013-03-13 MED ORDER — FUROSEMIDE 20 MG PO TABS: 20.0000 mg | ORAL_TABLET | Freq: Every day | ORAL | Status: DC

## 2013-03-13 NOTE — Progress Notes (Signed)
Subjective:    Patient ID: Teresa Benson, female    DOB: 09-Feb-1951    MRN: [id number]  Brief patient profile:  86 yobf never smoker with pattern of recurrent bronchitis esp in winter with one severe enough to crack ribs around 2000 but in between no cough or sob or need for any inhalers self-referred 09/15/2011 for persistent cough since mid October 2012   09/15/2011 1st pulmonary office eval/ Wert on coreg  cc acute mostly dry onset cough with sensation of post pnds  mid Oct with nl cxr mid nov  at [facility] pak  then LUQ pain x 2 days Prior to day of OV  On  prilosec q day before bfast with intermittent overt hb but no active sinus complaints and no h/o childhood allergy or asthma.  NO BETTER on day of ov.  rec Prilosec 20 mg Take x 2  30-60 min before first meal of the day  And Pepcid 20 mg one at bedtime GERD   Stop corevidol Bystolic 10 mg one twice daily Take mucinex dm two every 12 hours and supplement if needed with  tramadol 50 mg up to 2 every 4 hours  Once you have eliminated the cough for 3 straight days try reducing the tramadol first,  then the delsym as tolerated.   Prednisone 10 mg take  4 each am x 2 days,   2 each am x 2 days,  1 each am x2days and stop  11/2011 admitted [name] x one week no dx but ended up off bystolic and on lopressor 100 bid due to tachycardia apparently   12/25/2011 f/u ov/Wert back on high dose lopressor twice daily  cc cough back for 2 weeks mostly dry, has trouble in the allergy season with lots of itching sneezing but not typically coughing. Using albuterol sev times a week was told to consider neb albuterol ? By same doctor who rec high dose lopressor? Not sure. [place].  No sob unless coughing rec Singulair 10 mg one daily until you return Stay on prilosec x 2 x 30 min before first meal and pepcid 20 mg at bedtime Prednisone 10 mg take  4 each am x 2 days,   2 each am x 2 days,  1 each am x2days  and stop  If start bad cough, first start using the flutter valve Take delsym two tsp every 12 hours and supplement if needed with  tramadol 50 mg up to 2 every 4 hours F/u 4 weeks > did not return   07/10/2012 f/u ov/Wert not on singulair/ did stay  on prilosec 20mg  bid ac and pepcid one at bedtime does not know dose but continued to have severe cough "every few months". Present Cough x 7 days typical  honking dry sound, not using flutter, no better with proventil which ran out 9/29 >>changed lopressor to bisoprolol , added singulair, tramadol/delsym for cough , pred taper off  07/16/2012 Follow up and med calendar  Patient returns for a one-week followup for cough. She is brought all of her medications for a medication review. We reviewed all her medications and organized them into a medication calendar with patient education. Last visit. Patient was changed from Lopressor to bisoprolol. However, patient misunderstood instructions and took both prescriptions. She was also started on Singulair along with tramadol and Delsym for cough. She was given a prednisone taper. Overall she feels much improved. Her cough is approximately 75% better.  She does continue to get some coughing fits. Cough  mainly dry in nature. rec Stop Metoprolol  Continue on Bisoprolol daily  Follow med calendar closely and bring to each visit.   08/14/2012 f/u ov/Wert using calendar well cc cough back the same, and really only better while on tramadol and even then still had urge to clear throat but no excess mucus or sob. rec Stop singulair and losartan Double your hydrodiuril to 25 mg total daily Prednisone 10 mg take  4 each am x 2 days,   2 each am x 2 days,  1 each am x2days and stop  Take delsym two tsp every 12 hours and supplement if needed with  tramadol 50 mg up to 2 every 4 hours to suppress the urge to cough.  For drainage, take chlortrimeton 4 mg every 6 hours as needed to see if it helps GERD  diet   08/29/2012 f/u ov/Wert  using calendar but didn't bring it, no longer needs cough meds or h1 at all with no sign cough or sob.   rec Follow calendar  11/20/2012 acute w/u in ov /Wert did not bring calendar  But "use it every day" cc worse cough and sob x 10 days, nonproductive , coughs to point of loosing her breath. gen ant chest discomfort brought on my coughing.  Cough more day than night. >>Pred taper , CT sinus >  12/12/2012 Follow up and med review  Returns for 3 week follow up and med review. Continues to have daily cough . tx with steroid taper last ov with some help but cough never went away and as soon as steroids done cough worsens each day.  Taking delsym and tramadol for cough control w/ some help.  We reviewed all her meds and organized them into a med calendar w/ pt education . Appears she is taking correctly.   Underwent CT sinus on 2/17 that showed  Complete opacification visualized aspect of the left  frontal sinus.  Moderate mucosal thickening / opacification ethmoid sinus air cells bilaterally. Opacification/mucosal thickening maxillary sinuses greater on the right.  > referred to ent / [name]/ rec consider surgery and did not f/u  Admission date: 03/02/2013  Discharge Date: 03/04/2013  Primary MD [name], MD  Admitting Physician [name], MD  Admission Diagnosis HTN (hypertension) [401.9]  Dyspnea [786.09]  Tachycardia [785.0]  Dyslipidemia [272.4]  Acute bronchospasm [519.11]  Chronic sinusitis [473.9]    03/05/2013 p  hosp ov/Wert rx with lasix bid despite BNP only 155 Chief Complaint  Patient presents with  . HFU    Pt states that her breathing has improves, but not back at her normal baseline yet. No new co's.    cough is day = night dry not wet, now has neb for paroxyms of sob typically with cough.   03/13/2013 Follow up and med review  Pt returns for follow up and med review .  We reviewed all her meds and organized them into a  med calendar w/ pt education  Appears to be taking meds correctly.   Better but cough is not completely gone.   No fever , hemoptysis, chest pain or edema.   Current Medications, Allergies, Past Medical History, Past Surgical History, Family History, and Social History were reviewed in [employer] record.  ROS  The following are not active complaints unless bolded sore throat, dysphagia, dental problems, itching, sneezing,  nasal congestion or excess/ purulent secretions, ear ache,   fever,  chills, sweats, unintended wt loss, pleuritic or exertional cp, hemoptysis,  orthopnea pnd or leg swelling, presyncope, palpitations, heartburn, abdominal pain, anorexia, nausea, vomiting, diarrhea  or change in bowel or urinary habits, change in stools or urine, dysuria,hematuria,  rash, arthralgias, visual complaints, headache, numbness weakness or ataxia or problems with walking or coordination,  change in mood/affect or memory.                      Objective:   Physical Exam  Edentulous bf nad mild nasal tone to voice  Wt 126  09/15/11 > 12/25/2011  128 > 07/10/2012  139>142 07/16/2012 > 08/14/2012 143> 08/29/2012  143> 11/20/2012  145 >146 12/12/2012 > 03/05/2013   149>>149 6/5   HEENT: nl dentition, turbinates, and orophanx. Nl external ear canals without cough reflex   NECK :  without JVD/Nodes/TM/ nl carotid upstrokes bilaterally   LUNGS: no acc muscle use, clear to A and P bilaterally     CV:  RRR  no s3 or murmur or increase in P2, no edema   ABD:  soft and nontender with nl excursion in the supine position. No bruits or organomegaly, bowel sounds nl  MS:  warm without deformities, calf tenderness, cyanosis or clubbing          .      CXR  03/05/2013 :  No acute abnormalities.  03/05/13 spirometry s obstruction        Assessment & Plan:

## 2013-03-13 NOTE — Patient Instructions (Addendum)
Continue on cough regimen  Follow med calendar closely and bring to each visit.  Please contact office for sooner follow up if symptoms do not improve or worsen or seek emergency care  follow up Dr. Sherene Sires  In 2 months and As needed

## 2013-03-14 ENCOUNTER — Encounter: Payer: Self-pay | Admitting: Internal Medicine

## 2013-03-14 LAB — BASIC METABOLIC PANEL

## 2013-03-14 LAB — ALLERGY PROFILE REGION II-DC, DE, MD, ~~LOC~~, VA
Alternaria Alternata: <0.1 kU/L
Aspergillus fumigatus, m3: <0.1 kU/L
Cat Dander: <0.1 kU/L
Cladosporium Herbarum: <0.1 kU/L
Cockroach: <0.1 kU/L
Elm IgE: <0.1 kU/L
IgE (Immunoglobulin E), Serum: 86.6 IU/mL (ref 0.0–180.0)
Johnson Grass: <0.1 kU/L

## 2013-03-14 NOTE — Assessment & Plan Note (Signed)
Patient's medications were reviewed today and patient education was given. Computerized medication calendar was adjusted/completed Encouraged on cough control regimen.   Plan  Continue on cough regimen  Follow med calendar closely and bring to each visit.  Please contact office for sooner follow up if symptoms do not improve or worsen or seek emergency care  follow up Dr. Sherene Sires  In 2 months and As needed

## 2013-03-17 ENCOUNTER — Other Ambulatory Visit (HOSPITAL_COMMUNITY): Payer: Self-pay | Admitting: Internal Medicine

## 2013-03-17 NOTE — Progress Notes (Signed)
Quick Note:  Spoke with pt and notified of results per Dr. Wert. Pt verbalized understanding and denied any questions.  ______ 

## 2013-03-31 ENCOUNTER — Other Ambulatory Visit: Payer: Self-pay | Admitting: Internal Medicine

## 2013-04-01 ENCOUNTER — Telehealth: Payer: Self-pay | Admitting: Internal Medicine

## 2013-04-01 NOTE — Telephone Encounter (Signed)
Refill request for tessalon

## 2013-04-01 NOTE — Telephone Encounter (Signed)
According to our charts this has already been done today. Pt is aware. Nothing further was needed.

## 2013-04-09 ENCOUNTER — Other Ambulatory Visit: Payer: Self-pay | Admitting: Cardiology

## 2013-04-09 DIAGNOSIS — G4700 Insomnia, unspecified: Secondary | ICD-10-CM

## 2013-04-17 NOTE — Addendum Note (Signed)
Addended by: Boone Master E on: 04/17/2013 03:34 PM   Modules accepted: Orders, Medications

## 2013-04-29 ENCOUNTER — Other Ambulatory Visit: Payer: Self-pay | Admitting: Internal Medicine

## 2013-04-30 ENCOUNTER — Encounter: Payer: Self-pay | Admitting: Critical Care Medicine

## 2013-04-30 ENCOUNTER — Ambulatory Visit (INDEPENDENT_AMBULATORY_CARE_PROVIDER_SITE_OTHER): Payer: Self-pay | Admitting: Critical Care Medicine

## 2013-04-30 VITALS — BP 144/86 | HR 59 | Temp 99.8°F | Ht 61.0 in | Wt 142.6 lb

## 2013-04-30 DIAGNOSIS — R05 Cough: Secondary | ICD-10-CM

## 2013-04-30 DIAGNOSIS — R059 Cough, unspecified: Secondary | ICD-10-CM

## 2013-04-30 DIAGNOSIS — J019 Acute sinusitis, unspecified: Secondary | ICD-10-CM

## 2013-04-30 MED ORDER — BUDESONIDE-FORMOTEROL FUMARATE 160-4.5 MCG/ACT IN AERO: 2.0000 | INHALATION_SPRAY | Freq: Two times a day (BID) | RESPIRATORY_TRACT | Status: DC

## 2013-04-30 MED ORDER — PREDNISONE 10 MG PO TABS: ORAL_TABLET | ORAL | Status: DC

## 2013-04-30 MED ORDER — ALBUTEROL SULFATE (2.5 MG/3ML) 0.083% IN NEBU
2.5000 mg | INHALATION_SOLUTION | Freq: Once | RESPIRATORY_TRACT | Status: AC
  Administered 2013-04-30: 2.5 mg via RESPIRATORY_TRACT

## 2013-04-30 MED ORDER — FLUTICASONE PROPIONATE 50 MCG/ACT NA SUSP: 2.0000 | Freq: Two times a day (BID) | NASAL | Status: DC

## 2013-04-30 MED ORDER — AMOXICILLIN-POT CLAVULANATE 875-125 MG PO TABS: 1.0000 | ORAL_TABLET | Freq: Two times a day (BID) | ORAL | Status: DC

## 2013-04-30 MED ORDER — METHYLPREDNISOLONE ACETATE 80 MG/ML IJ SUSP
120.0000 mg | Freq: Once | INTRAMUSCULAR | Status: AC
  Administered 2013-04-30: 120 mg via INTRAMUSCULAR

## 2013-04-30 NOTE — Progress Notes (Signed)
Subjective:    Patient ID: Teresa Benson, female    DOB: 1951/10/05, 62 y.o.   MRN: [id number]  HPI Brief patient profile:  63 yobf never smoker with pattern of recurrent bronchitis esp in winter with one severe enough to crack ribs around 2000 but in between no cough or sob or need for any inhalers self-referred 09/15/2011 for persistent cough since mid October 2012   09/15/2011 1st pulmonary office eval/ [facility] on coreg  cc acute mostly dry onset cough with sensation of post pnds  mid Oct with nl cxr mid nov  [facility]  then LUQ pain x 2 days Prior to day of OV  On  prilosec q day before bfast with intermittent overt hb but no active sinus complaints and no h/o childhood allergy or asthma.  NO BETTER on day of ov.  rec Prilosec 20 mg Take x 2  30-60 min before first meal of the day  And Pepcid 20 mg one at bedtime GERD   Stop corevidol Bystolic 10 mg one twice daily Take mucinex dm two every 12 hours and supplement if needed with  tramadol 50 mg up to 2 every 4 hours  Once you have eliminated the cough for 3 straight days try reducing the tramadol first,  then the delsym as tolerated.   Prednisone 10 mg take  4 each am x 2 days,   2 each am x 2 days,  1 each am x2days and stop  11/2011 admitted [name] x one week no dx but ended up off bystolic and on lopressor 100 bid due to tachycardia apparently   12/25/2011 f/u ov/Wert back on high dose lopressor twice daily  cc cough back for 2 weeks mostly dry, has trouble in the allergy season with lots of itching sneezing but not typically coughing. Using albuterol sev times a week was told to consider neb albuterol ? By same doctor who rec high dose lopressor? Not sure. [place].  No sob unless coughing rec Singulair 10 mg one daily until you return Stay on prilosec x 2 x 30 min before first meal and pepcid 20 mg at bedtime Prednisone 10 mg take  4 each am x 2 days,   2 each am x 2 days,  1 each  am x2days and stop  If start bad cough, first start using the flutter valve Take delsym two tsp every 12 hours and supplement if needed with  tramadol 50 mg up to 2 every 4 hours F/u 4 weeks > did not return   07/10/2012 f/u ov/Wert not on singulair/ did stay  on prilosec 20mg  bid ac and pepcid one at bedtime does not know dose but continued to have severe cough "every few months". Present Cough x 7 days typical  honking dry sound, not using flutter, no better with proventil which ran out 9/29 >>changed lopressor to bisoprolol , added singulair, tramadol/delsym for cough , pred taper off  07/16/2012 Follow up and med calendar  Patient returns for a one-week followup for cough. She is brought all of her medications for a medication review. We reviewed all her medications and organized them into a medication calendar with patient education. Last visit. Patient was changed from Lopressor to bisoprolol. However, patient misunderstood instructions and took both prescriptions. She was also started on Singulair along with tramadol and Delsym for cough. She was given a prednisone taper. Overall she feels much improved. Her cough is approximately  75% better. She does continue to get some coughing fits. Cough  mainly dry in nature. rec Stop Metoprolol  Continue on Bisoprolol daily  Follow med calendar closely and bring to each visit.   08/14/2012 f/u ov/Wert using calendar well cc cough back the same, and really only better while on tramadol and even then still had urge to clear throat but no excess mucus or sob. rec Stop singulair and losartan Double your hydrodiuril to 25 mg total daily Prednisone 10 mg take  4 each am x 2 days,   2 each am x 2 days,  1 each am x2days and stop  Take delsym two tsp every 12 hours and supplement if needed with  tramadol 50 mg up to 2 every 4 hours to suppress the urge to cough.  For drainage, take chlortrimeton 4 mg every 6 hours as needed to see if it helps GERD  diet   08/29/2012 f/u ov/Wert  using calendar but didn't bring it, no longer needs cough meds or h1 at all with no sign cough or sob.   rec Follow calendar  11/20/2012 acute w/u in ov /[facility]ert did not bring calendar  But "use it every day" cc worse cough and sob x 10 days, nonproductive , coughs to point of loosing her breath. gen ant chest discomfort brought on my coughing.  Cough more day than night. >>Pred taper , CT sinus >  12/12/2012 Follow up and med review  Returns for 3 week follow up and med review. Continues to have daily cough . tx with steroid taper last ov with some help but cough never went away and as soon as steroids done cough worsens each day.  Taking delsym and tramadol for cough control w/ some help.  We reviewed all her meds and organized them into a med calendar w/ pt education . Appears she is taking correctly.   Underwent CT sinus on 2/17 that showed  Complete opacification visualized aspect of the left  frontal sinus.  Moderate mucosal thickening / opacification ethmoid sinus air cells bilaterally. Opacification/mucosal thickening maxillary sinuses greater on the right.  > referred to ent / [name]/ rec consider surgery and did not f/u  Admission date: 03/02/2013  Discharge Date: 03/04/2013  Primary MD [name], MD  Admitting Physician [name], MD  Admission Diagnosis HTN (hypertension) [401.9]  Dyspnea [786.09]  Tachycardia [785.0]  Dyslipidemia [272.4]  Acute bronchospasm [519.11]  Chronic sinusitis [473.9]    03/05/2013 p  hosp ov/Wert rx with lasix bid despite BNP only 155 Chief Complaint  Patient presents with  . HFU    Pt states that her breathing has improves, but not back at her normal baseline yet. No new co's.    cough is day = night dry not wet, now has neb for paroxyms of sob typically with cough.  No obvious daytime variabilty or   cp or chest tightness, subjective wheeze overt sinus or hb symptoms. No unusual exp hx or h/o  childhood pna/ asthma or premature birth to her knowledge.   Also denies any obvious fluctuation of symptoms with weather or environmental changes or other aggravating or alleviating factors except as outlined above.  04/30/2013 Chief Complaint  Patient presents with  . Acute Visit    MW pt.  nonprod cough, wheezing, increased SOB all day, and chest tightness x 1 wk.  Pt noting onset for one week wheezing and cough Cough is dry.  Notes severe dyspnea . Prednisone helps, when off is  worse  the patient notes increasing shortness of breath. There is increasing facial pressure around the cheek bones. There is noted to be increased wheezing and difficulty with breath.        Review of Systems Constitutional:   No  weight loss, night sweats,  Fevers, chills, fatigue, lassitude. HEENT:   No headaches,  Difficulty swallowing,  Tooth/dental problems,  Sore throat,                No sneezing, itching, ear ache, +++nasal congestion,+++ post nasal drip,   CV:  No chest pain,  Orthopnea, PND, swelling in lower extremities, anasarca, dizziness, palpitations  GI  No heartburn, indigestion, abdominal pain, nausea, vomiting, diarrhea, change in bowel habits, loss of appetite  Resp: +++shortness of breath with exertion not at rest.   +++ excess mucus, +++productive cough,  No non-productive cough,  No coughing up of blood.  No change in color of mucus.  No wheezing.  No chest wall deformity  Skin: no rash or lesions.  GU: no dysuria, change in color of urine, no urgency or frequency.  No flank pain.  MS:  No joint pain or swelling.  No decreased range of motion.  No back pain.  Psych:  No change in mood or affect. No depression or anxiety.  No memory loss.     Objective:   Physical Exam Filed Vitals:   04/30/13 1107  BP: 144/86  Pulse: 59  Temp: 99.8 F (37.7 C)  TempSrc: Oral  Height: 5\' 1"  (1.549 m)  Weight: 142 lb 9.6 oz (64.683 kg)  SpO2: 98%    Gen: anxious AAF in mild resp  distress, improved after BD neb MED  ENT: No lesions,  mouth clear,  oropharynx clear,  +++ postnasal drip, L > R nasal purulence  Neck: No JVD, no TMG, no carotid bruits  Lungs: No use of accessory muscles, no dullness to percussion, inspiratory and expiratory wheezes with poor airflow Cardiovascular: RRR, heart sounds normal, no murmur or gallops, no peripheral edema  Abdomen: soft and NT, no HSM,  BS normal  Musculoskeletal: No deformities, no cyanosis or clubbing  Neuro: alert, non focal  Skin: Warm, no lesions or rashes  No results found.        Assessment & Plan:

## 2013-04-30 NOTE — Patient Instructions (Addendum)
Albuterol neb given Prednisone 10mg  Take 4 for four days 3 for four days 2 for four days 1 for four days Augmentin one twice daily for 10days Start symbicort two puff twice daily, use spacer Flonase two puff ea nostril TWICE daily All above sent to [facility] Use neil med sinus rinse twice daily  A depomedrol 120mg  IM was given along with albuterol Neb x 1 Return 1 week with Tammy Parrett , bring med calendar Return 3-4 weeks with Dr Sherene Sires

## 2013-04-30 NOTE — Assessment & Plan Note (Signed)
Cyclic cough with evidence on physical exam for lower airway inflammation in addition to upper airway instability. Also note evidence for acute sinusitis left greater than right maxillary sinuses on physical exam with postnasal drip syndrome In this setting the patient would benefit from bronchodilator therapy, systemic steroids, and intensive antibiotic therapy with nasal hygiene  Plan Albuterol neb given Prednisone 10mg  Take 4 for four days 3 for four days 2 for four days 1 for four days Augmentin one twice daily for 10days Start symbicort two puff twice daily, use spacer Flonase two puff ea nostril TWICE daily All above sent to [facility] Use neil med sinus rinse twice daily  A depomedrol 120mg  IM was given along with albuterol Neb x 1 Return 1 week with Tammy Parrett , bring med calendar Return 3-4 weeks with Dr Sherene Sires

## 2013-05-07 ENCOUNTER — Ambulatory Visit: Payer: Medicare Other | Admitting: Adult Health

## 2013-05-15 ENCOUNTER — Other Ambulatory Visit: Payer: Self-pay | Admitting: Internal Medicine

## 2013-05-28 ENCOUNTER — Ambulatory Visit: Payer: Medicare Other | Admitting: Internal Medicine

## 2013-06-11 ENCOUNTER — Encounter: Payer: Self-pay | Admitting: Internal Medicine

## 2013-06-11 ENCOUNTER — Ambulatory Visit (INDEPENDENT_AMBULATORY_CARE_PROVIDER_SITE_OTHER): Payer: Medicare Other | Admitting: Internal Medicine

## 2013-06-11 VITALS — BP 124/80 | HR 80 | Temp 97.4°F | Ht 61.0 in | Wt 142.2 lb

## 2013-06-11 DIAGNOSIS — R059 Cough, unspecified: Secondary | ICD-10-CM

## 2013-06-11 DIAGNOSIS — R05 Cough: Secondary | ICD-10-CM

## 2013-06-11 MED ORDER — PREDNISONE (PAK) 10 MG PO TABS: ORAL_TABLET | ORAL | Status: DC

## 2013-06-11 MED ORDER — BUDESONIDE-FORMOTEROL FUMARATE 80-4.5 MCG/ACT IN AERO: 2.0000 | INHALATION_SPRAY | Freq: Two times a day (BID) | RESPIRATORY_TRACT | Status: DC

## 2013-06-11 MED ORDER — CHLORPHENIRAMINE MALEATE 4 MG PO TABS: ORAL_TABLET | ORAL | Status: DC

## 2013-06-11 MED ORDER — TRAMADOL HCL 50 MG PO TABS: ORAL_TABLET | ORAL | Status: DC

## 2013-06-11 NOTE — Patient Instructions (Addendum)
Try symbicort 80 Take 2 puffs first thing in am and then another 2 puffs about 12 hours later.   Prednisone 10 mg take  4 each am x 2 days,   2 each am x 2 days,  1 each am x 2 days and stop   Pantoprazole (protonix) 40 mg   Take 30-60 min before first meal of the day and Pepcid 20 mg one bedtime    Take delsym two tsp every 12 hours and supplement if needed with  tramadol 50 mg up to 2 every 4 hours to suppress the urge to cough. Swallowing water or using ice chips/non mint and menthol containing candies (such as lifesavers or sugarless jolly ranchers) are also effective.  You should rest your voice and avoid activities that you know make you cough.  Once you have eliminated the cough for 3 straight days try reducing the tramadol first,  then the delsym as tolerated.    Please schedule a follow up office visit in 2 weeks, sooner if needed with all meds in hand in two bags as per calendar to see Tammy NP

## 2013-06-11 NOTE — Progress Notes (Signed)
Subjective:    Patient ID: Teresa Benson, female    DOB: [birth year]    MRN: [id number]  Brief patient profile:  72 yobf never smoker with pattern of recurrent bronchitis esp in winter with one severe enough to crack ribs around 2000 but in between no cough or sob or need for any inhalers self-referred 09/15/2011 for persistent cough since mid October 2012   09/15/2011 1st pulmonary office eval/ Teresa Benson on coreg  cc acute mostly dry onset cough with sensation of post pnds  mid Oct with nl cxr mid nov  at [facility] pak  then LUQ pain x 2 days Prior to day of OV  On  prilosec q day before bfast with intermittent overt hb but no active sinus complaints and no h/o childhood allergy or asthma.  NO BETTER on day of ov.  rec Prilosec 20 mg Take x 2  30-60 min before first meal of the day  And Pepcid 20 mg one at bedtime GERD   Stop corevidol Bystolic 10 mg one twice daily Take mucinex dm two every 12 hours and supplement if needed with  tramadol 50 mg up to 2 every 4 hours  Once you have eliminated the cough for 3 straight days try reducing the tramadol first,  then the delsym as tolerated.   Prednisone 10 mg take  4 each am x 2 days,   2 each am x 2 days,  1 each am x2days and stop  11/2011 admitted Teresa Benson x one week no dx but ended up off bystolic and on lopressor 100 bid due to tachycardia apparently   12/25/2011 f/u ov/Teresa Benson back on high dose lopressor twice daily  cc cough back for 2 weeks mostly dry, has trouble in the allergy season with lots of itching sneezing but not typically coughing. Using albuterol sev times a week was told to consider neb albuterol ? By same doctor who rec high dose lopressor? Not sure. [place].  No sob unless coughing rec Singulair 10 mg one daily until you return Stay on prilosec x 2 x 30 min before first meal and pepcid 20 mg at bedtime Prednisone 10 mg take  4 each am x 2 days,   2 each am x 2 days,  1 each am x2days  and stop  If start bad cough, first start using the flutter valve Take delsym two tsp every 12 hours and supplement if needed with  tramadol 50 mg up to 2 every 4 hours F/u 4 weeks > did not return   07/10/2012 f/u ov/Teresa Benson not on singulair/ did stay  on prilosec 20mg  bid ac and pepcid one at bedtime does not know dose but continued to have severe cough "every few months". Present Cough x 7 days typical  honking dry sound, not using flutter, no better with proventil which ran out 9/29 >>changed lopressor to bisoprolol , added singulair, tramadol/delsym for cough , pred taper off  07/16/2012 Follow up and med calendar  Patient returns for a one-week followup for cough. She is brought all of her medications for a medication review. We reviewed all her medications and organized them into a medication calendar with patient education. Last visit. Patient was changed from Lopressor to bisoprolol. However, patient misunderstood instructions and took both prescriptions. She was also started on Singulair along with tramadol and Delsym for cough. She was given a prednisone taper. Overall she feels much improved. Her cough is approximately 75% better.  She does continue to get some coughing fits. Cough  mainly dry in nature. rec Stop Metoprolol  Continue on Bisoprolol daily  Follow med calendar closely and bring to each visit.   08/14/2012 f/u ov/Teresa Benson using calendar well cc cough back the same, and really only better while on tramadol and even then still had urge to clear throat but no excess mucus or sob. rec Stop singulair and losartan Double your hydrodiuril to 25 mg total daily Prednisone 10 mg take  4 each am x 2 days,   2 each am x 2 days,  1 each am x2days and stop  Take delsym two tsp every 12 hours and supplement if needed with  tramadol 50 mg up to 2 every 4 hours to suppress the urge to cough.  For drainage, take chlortrimeton 4 mg every 6 hours as needed to see if it helps GERD  diet   08/29/2012 f/u ov/Teresa Benson  using calendar but didn't bring it, no longer needs cough meds or h1 at all with no sign cough or sob.   rec Follow calendar  11/20/2012 acute w/u in ov /Teresa Benson did not bring calendar  But "use it every day" cc worse cough and sob x 10 days, nonproductive , coughs to point of loosing her breath. gen ant chest discomfort brought on my coughing.  Cough more day than night. >>Pred taper , CT sinus >  12/12/2012 Follow up and med review  Returns for 3 week follow up and med review. Continues to have daily cough . tx with steroid taper last ov with some help but cough never went away and as soon as steroids done cough worsens each day.  Taking delsym and tramadol for cough control w/ some help.  We reviewed all her meds and organized them into a med calendar w/ pt education . Appears she is taking correctly.   Underwent CT sinus on 2/17 that showed  Complete opacification visualized aspect of the left  frontal sinus.  Moderate mucosal thickening / opacification ethmoid sinus air cells bilaterally. Opacification/mucosal thickening maxillary sinuses greater on the right.  > referred to ent / Teresa Benson/ rec consider surgery and did not f/u  Admission date: 03/02/2013  Discharge Date: 03/04/2013  Primary MD Teresa Clement, MD  Admitting Physician Teresa Parker, MD  Admission Diagnosis HTN (hypertension) [401.9]  Dyspnea [786.09]  Tachycardia [785.0]  Dyslipidemia [272.4]  Acute bronchospasm [519.11]  Chronic sinusitis [473.9]    03/05/2013 p  hosp ov/Teresa Benson rx with lasix bid despite BNP only 155 Chief Complaint  Patient presents with  . HFU    Pt states that her breathing has improves, but not back at her normal baseline yet. No new co's.    cough is day = night dry not wet, now has neb for paroxyms of sob typically with cough. rec Continue to taper the prednisone off Take delsym two tsp every 12 hours and supplement if needed with  tramadol 50 mg up to 2 every  4 hours to suppress the urge to cough. S .   Also see make Appt to see Teresa Benson re possible sinus surgery. Late add : creat up to 1.7 so change lasix to prn and recheck one week  04/30/13 Dr Delford Field, start flonase, symbicort   06/11/2013 f/u ov/Lauris Keepers re cough Chief Complaint  Patient presents with  . Follow-up    Pt states cough had improved with abx and pred prescribed at last ov, but is worse now for the past wk. She  states wheezing is more intense.   ? Better symbicort 160 2 bid until ran out.  Cough is mostly dry. saba need up when ran out of symbicort  No obvious daytime variabilty or assoc   cp or chest tightness,   overt sinus or hb symptoms. No unusual exp hx or h/o childhood pna/ asthma or knowledge of premature birth.     Current Medications, Allergies, Past Medical History, Past Surgical History, Family History, and Social History were reviewed in [employer] record.  ROS  The following are not active complaints unless bolded sore throat, dysphagia, dental problems, itching, sneezing,  nasal congestion or excess/ purulent secretions, ear ache,   fever, chills, sweats, unintended wt loss, pleuritic or exertional cp, hemoptysis,  orthopnea pnd or leg swelling, presyncope, palpitations, heartburn, abdominal pain, anorexia, nausea, vomiting, diarrhea  or change in bowel or urinary habits, change in stools or urine, dysuria,hematuria,  rash, arthralgias, visual complaints, headache, numbness weakness or ataxia or problems with walking or coordination,  change in mood/affect or memory.                      Objective:   Physical Exam  Edentulous bf nad mild nasal tone to voice  Wt 126  09/15/11 > 12/25/2011  128 > 07/10/2012  139>142 07/16/2012 > 08/14/2012 143> 08/29/2012  143> 11/20/2012  145 >146 12/12/2012 > 03/05/2013   149>  142 06/11/2013   HEENT: nl dentition, turbinates, and orophanx. Nl external ear canals without cough reflex   NECK :  without  JVD/Nodes/TM/ nl carotid upstrokes bilaterally   LUNGS: no acc muscle use, clear to A and P bilaterally     CV:  RRR  no s3 or murmur or increase in P2, no edema   ABD:  soft and nontender with nl excursion in the supine position. No bruits or organomegaly, bowel sounds nl  MS:  warm without deformities, calf tenderness, cyanosis or clubbing          .      CXR  03/05/2013 :  No acute abnormalities.  03/05/13 spirometry s obstruction        Assessment & Plan:

## 2013-06-15 NOTE — Assessment & Plan Note (Signed)
-   PFT's wnl 12/25/2011    - Trial of singulair maint rx 12/25/2011  > did not maintain, restart effective 07/11/2012 > no better 08/14/12 so d/c'd   - med calendar 07/16/2012 > 03/13/13    - Sinus CT 11/20/2012 > Complete opacification visualized aspect of the left frontal sinus. Mild mucosal thickening right frontal sinus.Moderate mucosal thickening / opacification ethmoid sinus air cells bilaterally. No evidence of intraorbital extension.Opacification/mucosal thickening maxillary sinuses greater on the right.Mild mucosal thickening sphenoid sinus air cells.Limited evaluation infundibulum.Portions of the sinus opacification with central hyperdense material which may represent inspissated proteinaceous material. - Allergy profile 03/13/13 > Ig E 86.6 with no specific aeroallergens  Response to symbicort suggests possible cough variant asthma but not really certain at this point.  DDX of  difficult airways managment all start with A and  include Adherence, Ace Inhibitors, Acid Reflux, Active Sinus Disease, Alpha 1 Antitripsin deficiency, Anxiety masquerading as Airways dz,  ABPA,  allergy(esp in young), Aspiration (esp in elderly), Adverse effects of DPI,  Active smokers, plus two Bs  = Bronchiectasis and Beta blocker use..and one C= CHF  Adherence is always the initial "prime suspect" and is a multilayered concern that requires a "trust but verify" approach in every patient - starting with knowing how to use medications, especially inhalers, correctly, keeping up with refills and understanding the fundamental difference between maintenance and prns vs those medications only taken for a very short course and then stopped and not refilled.   The proper method of use, as well as anticipated side effects, of a metered-dose inhaler are discussed and demonstrated to the patient. Improved effectiveness after extensive coaching during this visit to a level of approximately  75% so continue symbicort bur try 80  2bid  ? Acid reflux > resume rx  ? Active sinus dz > if not improving next step is ENT eval as prev rec    Each maintenance medication was reviewed in detail including most importantly the difference between maintenance and as needed and under what circumstances the prns are to be used.  Please see instructions for details which were reviewed in writing and the patient given a copy.    F/u with med reconciliation in 2 weeks

## 2013-06-25 ENCOUNTER — Encounter: Payer: Medicare Other | Admitting: Adult Health

## 2013-08-04 IMAGING — CR DG CHEST 2V
2 series · 2 of 2 positions shown · non-contrast
Comparison: 09/15/2011

CLINICAL DATA: Cough, shortness of breath.

CHEST - 2 VIEW

[view not recorded (1 of 2)]
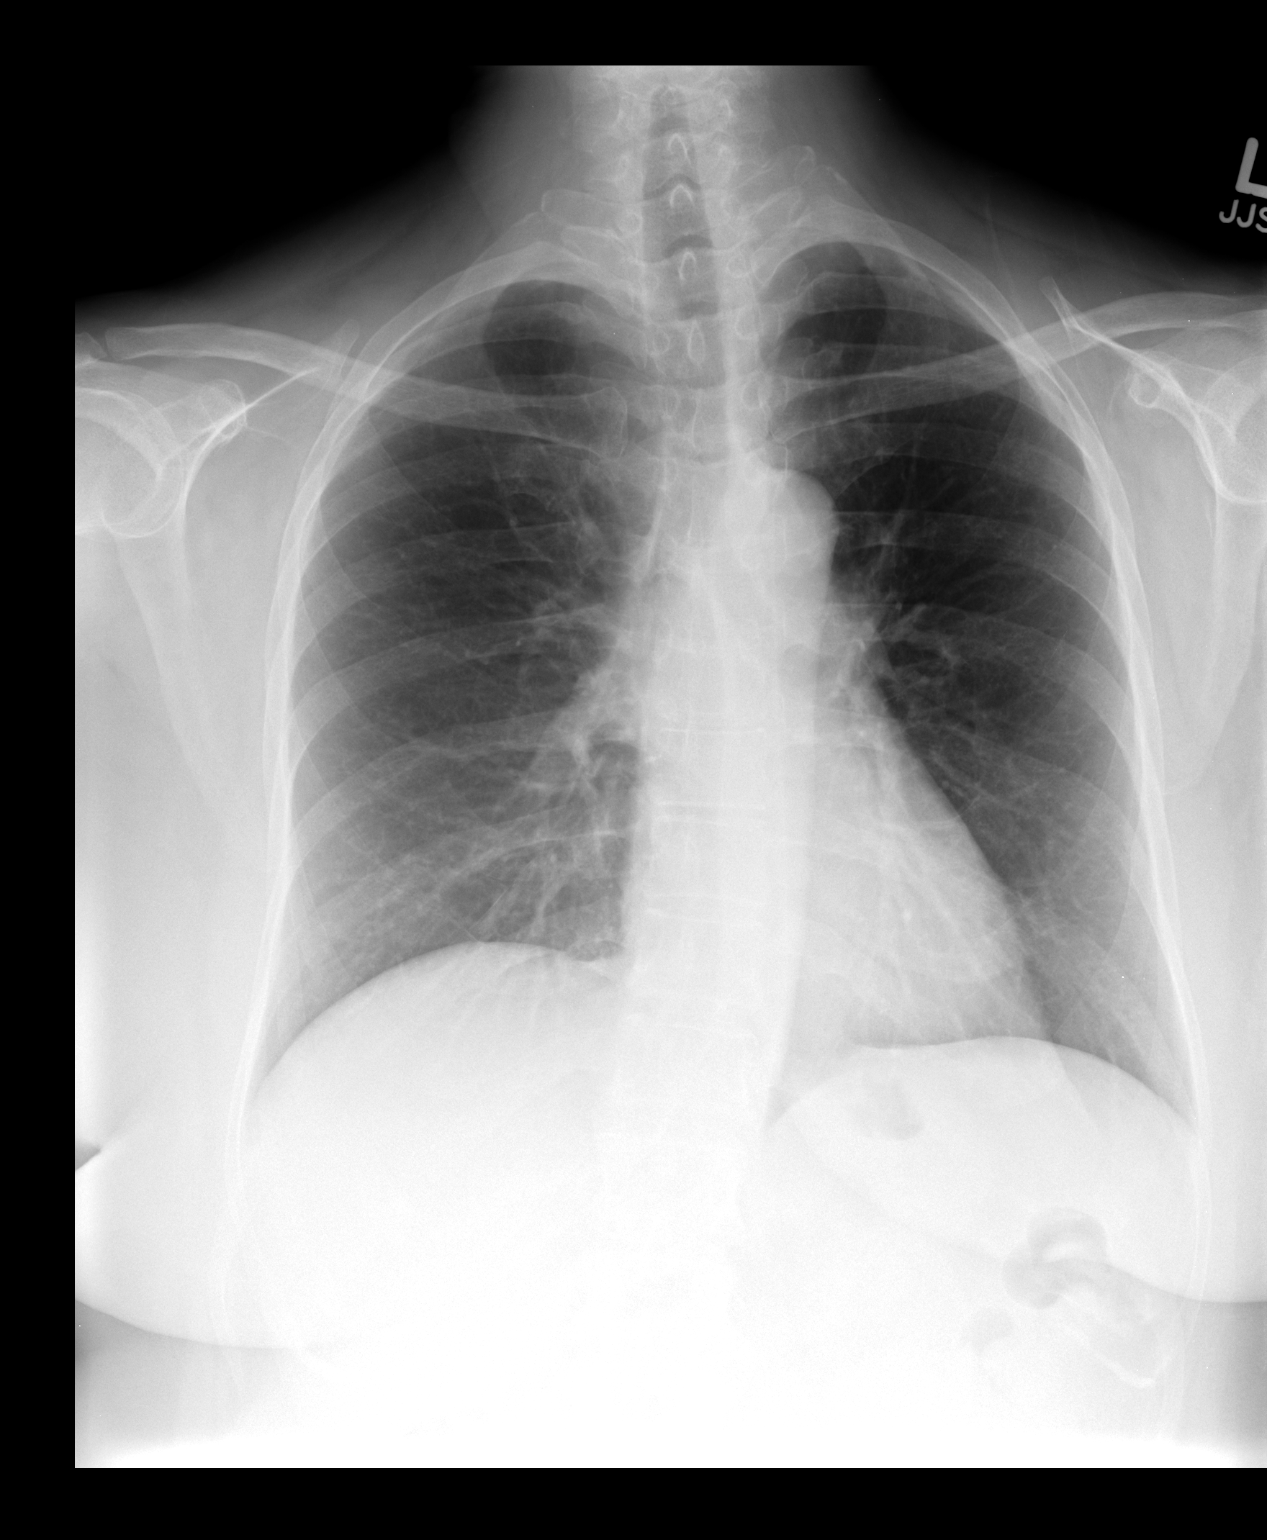

[view not recorded (2 of 2)]
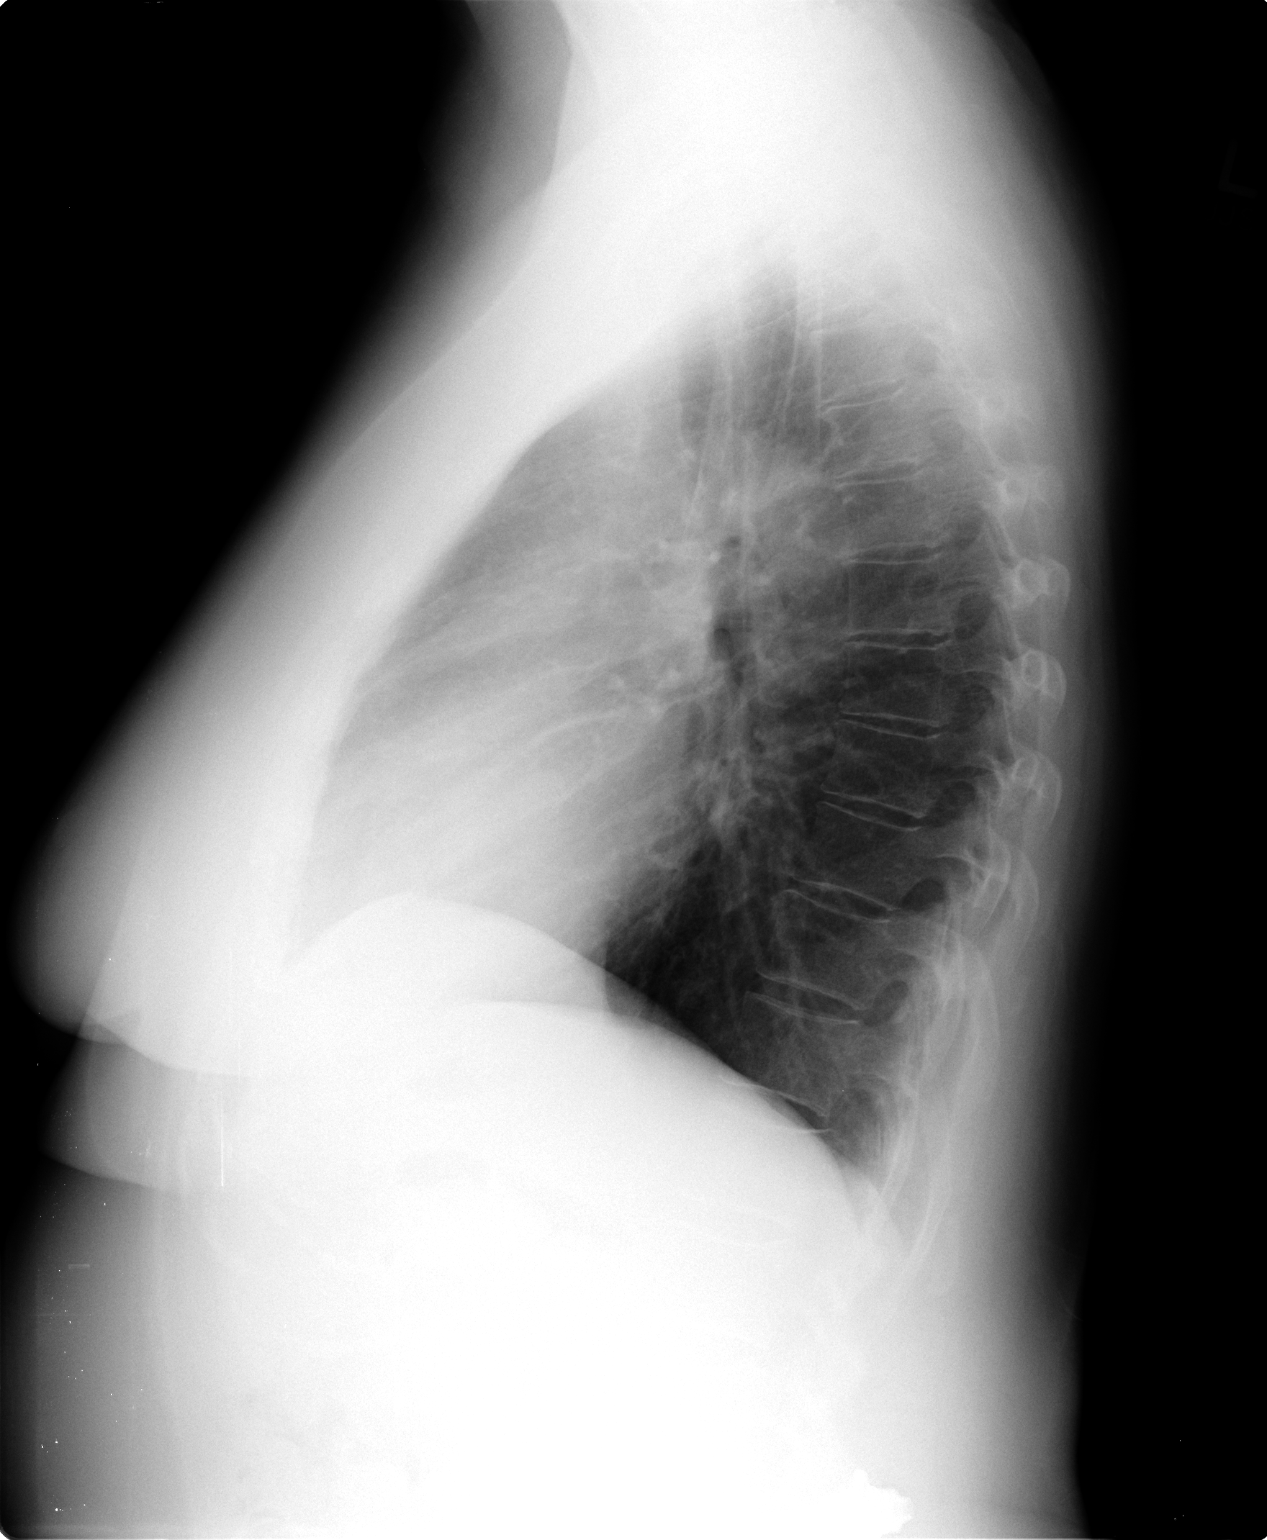

[2 of 2 positions shown; findings below may reference images not displayed]

FINDINGS: Heart and mediastinal contours are within normal limits.
No focal opacities or effusions.  No acute bony abnormality.
IMPRESSION: No active cardiopulmonary disease.

## 2013-08-12 ENCOUNTER — Other Ambulatory Visit: Payer: Self-pay | Admitting: Internal Medicine

## 2013-08-14 ENCOUNTER — Other Ambulatory Visit: Payer: Self-pay | Admitting: Internal Medicine

## 2013-09-16 ENCOUNTER — Other Ambulatory Visit: Payer: Self-pay | Admitting: Internal Medicine

## 2013-09-24 ENCOUNTER — Telehealth: Payer: Self-pay | Admitting: Internal Medicine

## 2013-09-24 ENCOUNTER — Ambulatory Visit (INDEPENDENT_AMBULATORY_CARE_PROVIDER_SITE_OTHER): Payer: Medicare Other | Admitting: Internal Medicine

## 2013-09-24 ENCOUNTER — Encounter: Payer: Self-pay | Admitting: Internal Medicine

## 2013-09-24 VITALS — BP 162/80 | HR 64 | Temp 98.4°F | Ht 62.0 in | Wt 137.0 lb

## 2013-09-24 DIAGNOSIS — R059 Cough, unspecified: Secondary | ICD-10-CM

## 2013-09-24 DIAGNOSIS — R05 Cough: Secondary | ICD-10-CM

## 2013-09-24 MED ORDER — ALBUTEROL SULFATE (2.5 MG/3ML) 0.083% IN NEBU: INHALATION_SOLUTION | RESPIRATORY_TRACT | Status: DC

## 2013-09-24 MED ORDER — PREDNISONE (PAK) 10 MG PO TABS: ORAL_TABLET | ORAL | Status: DC

## 2013-09-24 MED ORDER — TRAMADOL HCL 50 MG PO TABS: ORAL_TABLET | ORAL | Status: DC

## 2013-09-24 MED ORDER — PANTOPRAZOLE SODIUM 40 MG PO TBEC: 40.0000 mg | DELAYED_RELEASE_TABLET | Freq: Every day | ORAL | Status: DC

## 2013-09-24 NOTE — Progress Notes (Signed)
Subjective:    Patient ID: Teresa Benson, female    DOB: 1951/01/01    MRN: [id number]  Brief patient profile:  33 yobf never smoker with pattern of recurrent bronchitis esp in winter with one severe enough to crack ribs around 2000 but in between no cough or sob or need for any inhalers self-referred 09/15/2011 for persistent cough since mid October 2012    History of Present Illness  09/15/2011 1st pulmonary office eval/ Joel Cowin on coreg  cc acute mostly dry onset cough with sensation of post pnds  mid Oct with nl cxr mid nov  at [facility]ak  then LUQ pain x 2 days Prior to day of OV  On  prilosec q day before bfast with intermittent overt hb but no active sinus complaints and no h/o childhood allergy or asthma.  NO BETTER on day of ov.  rec Prilosec 20 mg Take x 2  30-60 min before first meal of the day  And Pepcid 20 mg one at bedtime GERD   Stop corevidol Bystolic 10 mg one twice daily Take mucinex dm two every 12 hours and supplement if needed with  tramadol 50 mg up to 2 every 4 hours  Once you have eliminated the cough for 3 straight days try reducing the tramadol first,  then the delsym as tolerated.   Prednisone 10 mg take  4 each am x 2 days,   2 each am x 2 days,  1 each am x2days and stop  11/2011 admitted [name] x one week no dx but ended up off bystolic and on lopressor 100 bid due to tachycardia apparently   12/25/2011 f/u ov/Peytan Andringa back on high dose lopressor twice daily  cc cough back for 2 weeks mostly dry, has trouble in the allergy season with lots of itching sneezing but not typically coughing. Using albuterol sev times a week was told to consider neb albuterol ? By same doctor who rec high dose lopressor? Not sure. [place] and [place].  No sob unless coughing rec Singulair 10 mg one daily until you return Stay on prilosec x 2 x 30 min before first meal and pepcid 20 mg at bedtime Prednisone 10 mg take  4 each am x 2 days,   2 each am  x 2 days,  1 each am x2days and stop  If start bad cough, first start using the flutter valve Take delsym two tsp every 12 hours and supplement if needed with  tramadol 50 mg up to 2 every 4 hours F/u 4 weeks > did not return   07/10/2012 f/u ov/Cort Dragoo not on singulair/ did stay  on prilosec 20mg  bid ac and pepcid one at bedtime does not know dose but continued to have severe cough "every few months". Present Cough x 7 days typical  honking dry sound, not using flutter, no better with proventil which ran out 9/29 >>changed lopressor to bisoprolol , added singulair, tramadol/delsym for cough , pred taper off  07/16/2012 Follow up and med calendar  Patient returns for a one-week followup for cough. She is brought all of her medications for a medication review. We reviewed all her medications and organized them into a medication calendar with patient education. Last visit. Patient was changed from Lopressor to bisoprolol. However, patient misunderstood instructions and took both prescriptions. She was also started on Singulair along with tramadol and Delsym for cough. She was given a prednisone taper. Overall she feels much improved.  Her cough is approximately 75% better. She does continue to get some coughing fits. Cough  mainly dry in nature. rec Stop Metoprolol  Continue on Bisoprolol daily  Follow med calendar closely and bring to each visit.   08/14/2012 f/u ov/Danton Palmateer using calendar well cc cough back the same, and really only better while on tramadol and even then still had urge to clear throat but no excess mucus or sob. rec Stop singulair and losartan Double your hydrodiuril to 25 mg total daily Prednisone 10 mg take  4 each am x 2 days,   2 each am x 2 days,  1 each am x2days and stop  Take delsym two tsp every 12 hours and supplement if needed with  tramadol 50 mg up to 2 every 4 hours to suppress the urge to cough.  For drainage, take chlortrimeton 4 mg every 6 hours as needed to see if  it helps GERD diet   08/29/2012 f/u ov/Jasmia Angst  using calendar but didn't bring it, no longer needs cough meds or h1 at all with no sign cough or sob.   rec Follow calendar  11/20/2012 acute w/u in ov /Grantley Savage did not bring calendar  But "use it every day" cc worse cough and sob x 10 days, nonproductive , coughs to point of loosing her breath. gen ant chest discomfort brought on my coughing.  Cough more day than night. >>Pred taper , CT sinus >  12/12/2012 Follow up and med review  Returns for 3 week follow up and med review. Continues to have daily cough . tx with steroid taper last ov with some help but cough never went away and as soon as steroids done cough worsens each day.  Taking delsym and tramadol for cough control w/ some help.  We reviewed all her meds and organized them into a med calendar w/ pt education . Appears she is taking correctly.   Underwent CT sinus on 2/17 that showed  Complete opacification visualized aspect of the left  frontal sinus.  Moderate mucosal thickening / opacification ethmoid sinus air cells bilaterally. Opacification/mucosal thickening maxillary sinuses greater on the right.  > referred to ent / [name]/ rec consider surgery and did not f/u  Admission date: 03/02/2013  Discharge Date: 03/04/2013  Primary MD [name], MD  Admitting Physician [name], MD  Admission Diagnosis HTN (hypertension) [401.9]  Dyspnea [786.09]  Tachycardia [785.0]  Dyslipidemia [272.4]  Acute bronchospasm [519.11]  Chronic sinusitis [473.9]       06/11/2013 f/u ov/Shalina Norfolk re cough Chief Complaint  Patient presents with  . Follow-up    Pt states cough had improved with abx and pred prescribed at last ov, but is worse now for the past wk. She states wheezing is more intense.   ? Better symbicort 160 2 bid until ran out.  Cough is mostly dry. saba need up when ran out of symbicort rec Prednisone 10 mg take  4 each am x 2 days,   2 each am x 2 days,  1 each am x 2  days and stop  Pantoprazole (protonix) 40 mg   Take 30-60 min before first meal of the day and Pepcid 20 mg one bedtime   Take delsym two tsp every 12 hours and supplement if needed with  tramadol 50 mg up to 2 every 4 hours to suppress the urge to cough. Please schedule a follow up office visit in 2 weeks, sooner if needed with all meds in hand in two  bags as per calendar to see Tammy NP   09/24/2013 f/u ov/Othon Guardia re: cough was better on symbicort but can't afford it ? Does she need sinus surg Chief Complaint  Patient presents with  . Acute Visit    Pt c/o increased SOB and cough x 2 days- cough is non prod.   was apparently again fine on symbicort, worse off it.  No purulent nasal secretions or obvious nasal flares.  Has med calendar but not using   No obvious pattern in day to day or daytime variabilty or assoc cp or chest tightness,   overt   hb symptoms. No unusual exp hx or h/o childhood pna/ asthma or knowledge of premature birth.     Current Medications, Allergies, Past Medical History, Past Surgical History, Family History, and Social History were reviewed in [employer] record.  ROS  The following are not active complaints unless bolded sore throat, dysphagia, dental problems, itching, sneezing,  nasal congestion or excess/ purulent secretions, ear ache,   fever, chills, sweats, unintended wt loss, pleuritic or exertional cp, hemoptysis,  orthopnea pnd or leg swelling, presyncope, palpitations, heartburn, abdominal pain, anorexia, nausea, vomiting, diarrhea  or change in bowel or urinary habits, change in stools or urine, dysuria,hematuria,  rash, arthralgias, visual complaints, headache, numbness weakness or ataxia or problems with walking or coordination,  change in mood/affect or memory.                      Objective:   Physical Exam  Edentulous bf nad  nad  Wt 126  09/15/11 > 12/25/2011  128 > 07/10/2012  139>142 07/16/2012 > 08/14/2012 143>  08/29/2012  143> 11/20/2012  145 >146 12/12/2012 > 03/05/2013   149>  142 06/11/2013 > 137  09/24/2013   HEENT: nl dentition, turbinates, and orophanx. Nl external ear canals without cough reflex   NECK :  without JVD/Nodes/TM/ nl carotid upstrokes bilaterally   LUNGS: no acc muscle use, min insp/ exp rhonchi bilaterally    CV:  RRR  no s3 or murmur or increase in P2, no edema   ABD:  soft and nontender with nl excursion in the supine position. No bruits or organomegaly, bowel sounds nl  MS:  warm without deformities, calf tenderness, cyanosis or clubbing          CXR  03/05/2013 : No acute abnormalities.  03/05/13 spirometry s obstruction        Assessment & Plan:

## 2013-09-24 NOTE — Telephone Encounter (Signed)
RX needing DX code on it. I have faxed this over to them.  lmomtcb x1 for pt

## 2013-09-24 NOTE — Patient Instructions (Addendum)
Restart symbicort 80  Take 2 puffs first thing in am and then another 2 puffs about 12 hours later.   Work on inhaler technique:  relax and gently blow all the way out then take a nice smooth deep breath back in, triggering the inhaler at same time you start breathing in.  Hold for up to 5 seconds if you can.  Rinse and gargle with water when done   Prednisone 10 mg take  4 each am x 2 days,   2 each am x 2 days,  1 each am x 2 days and stop   Pantoprazole (protonix) 40 mg   Take 30-60 min before first meal of the day and Pepcid 20 mg one bedtime    Take delsym two tsp every 12 hours and supplement if needed with  tramadol 50 mg up to 2 every 4 hours to suppress the urge to cough. Swallowing water or using ice chips/non mint and menthol containing candies (such as lifesavers or sugarless jolly ranchers) are also effective.  You should rest your voice and avoid activities that you know make you cough.  Once you have eliminated the cough for 3 straight days try reducing the tramadol first,  then the delsym as tolerated.    Please schedule a follow up office visit in 2 weeks, sooner if needed with all meds in hand in two bags as per calendar to see Tammy NP

## 2013-09-25 NOTE — Telephone Encounter (Signed)
Pt aware. Teresa Benson, CMA  

## 2013-09-25 NOTE — Assessment & Plan Note (Signed)
-   PFT's wnl 12/25/2011    - Trial of singulair maint rx 12/25/2011  > did not maintain, restart effective 07/11/2012 > no better 08/14/12 so d/c'd   - med calendar 07/16/2012 > 03/13/13    - Sinus CT 11/20/2012 > Complete opacification visualized aspect of the left frontal sinus. Mild mucosal thickening right frontal sinus.Moderate mucosal thickening / opacification ethmoid sinus air cells bilaterally. No evidence of intraorbital extension.Opacification/mucosal thickening maxillary sinuses greater on the right.Mild mucosal thickening sphenoid sinus air cells.Limited evaluation infundibulum.Portions of the sinus opacification with central hyperdense material which may represent inspissated proteinaceous material. - Allergy profile 03/13/13 > Ig E 86.6 with no specific aeroallergens  The proper method of use, as well as anticipated side effects, of a metered-dose inhaler are discussed and demonstrated to the patient. Improved effectiveness after extensive coaching during this visit to a level of approximately  75%     ddx is upper airway cough syndrome related to poorly controlled sinus dz but hard to tell cause and effect because won't stay on what we've established in the past to be effective therapy > if does fail optimal med rx then reasonable (p holidays) to consider sinus surgery.  rec rechallenge with symbicort 160 2bid and eliminate cyclical coughing then regroup for med reconciliation   To keep things simple, I have asked the patient to first separate medicines that are perceived as maintenance, that is to be taken daily "no matter what", from those medicines that are taken on only on an as-needed basis and I have given the patient examples of both, and then return to see our NP to generate a  detailed  medication calendar which should be followed until the next physician sees the patient and updates it.

## 2013-10-14 ENCOUNTER — Encounter: Payer: Medicare Other | Admitting: Adult Health

## 2013-10-15 ENCOUNTER — Other Ambulatory Visit: Payer: Self-pay | Admitting: Adult Health

## 2013-10-15 ENCOUNTER — Other Ambulatory Visit: Payer: Self-pay | Admitting: Internal Medicine

## 2013-11-25 ENCOUNTER — Other Ambulatory Visit: Payer: Self-pay | Admitting: Adult Health

## 2013-12-15 ENCOUNTER — Other Ambulatory Visit: Payer: Self-pay | Admitting: Critical Care Medicine

## 2014-01-16 ENCOUNTER — Other Ambulatory Visit: Payer: Self-pay | Admitting: Internal Medicine

## 2014-02-14 ENCOUNTER — Other Ambulatory Visit: Payer: Self-pay | Admitting: Internal Medicine

## 2014-02-16 IMAGING — CR DG CHEST 2V
2 series · 2 of 2 positions shown · non-contrast
Comparison: 12/17/2012

CLINICAL DATA: Cough and congestion

CHEST - 2 VIEW

[w chest pa]
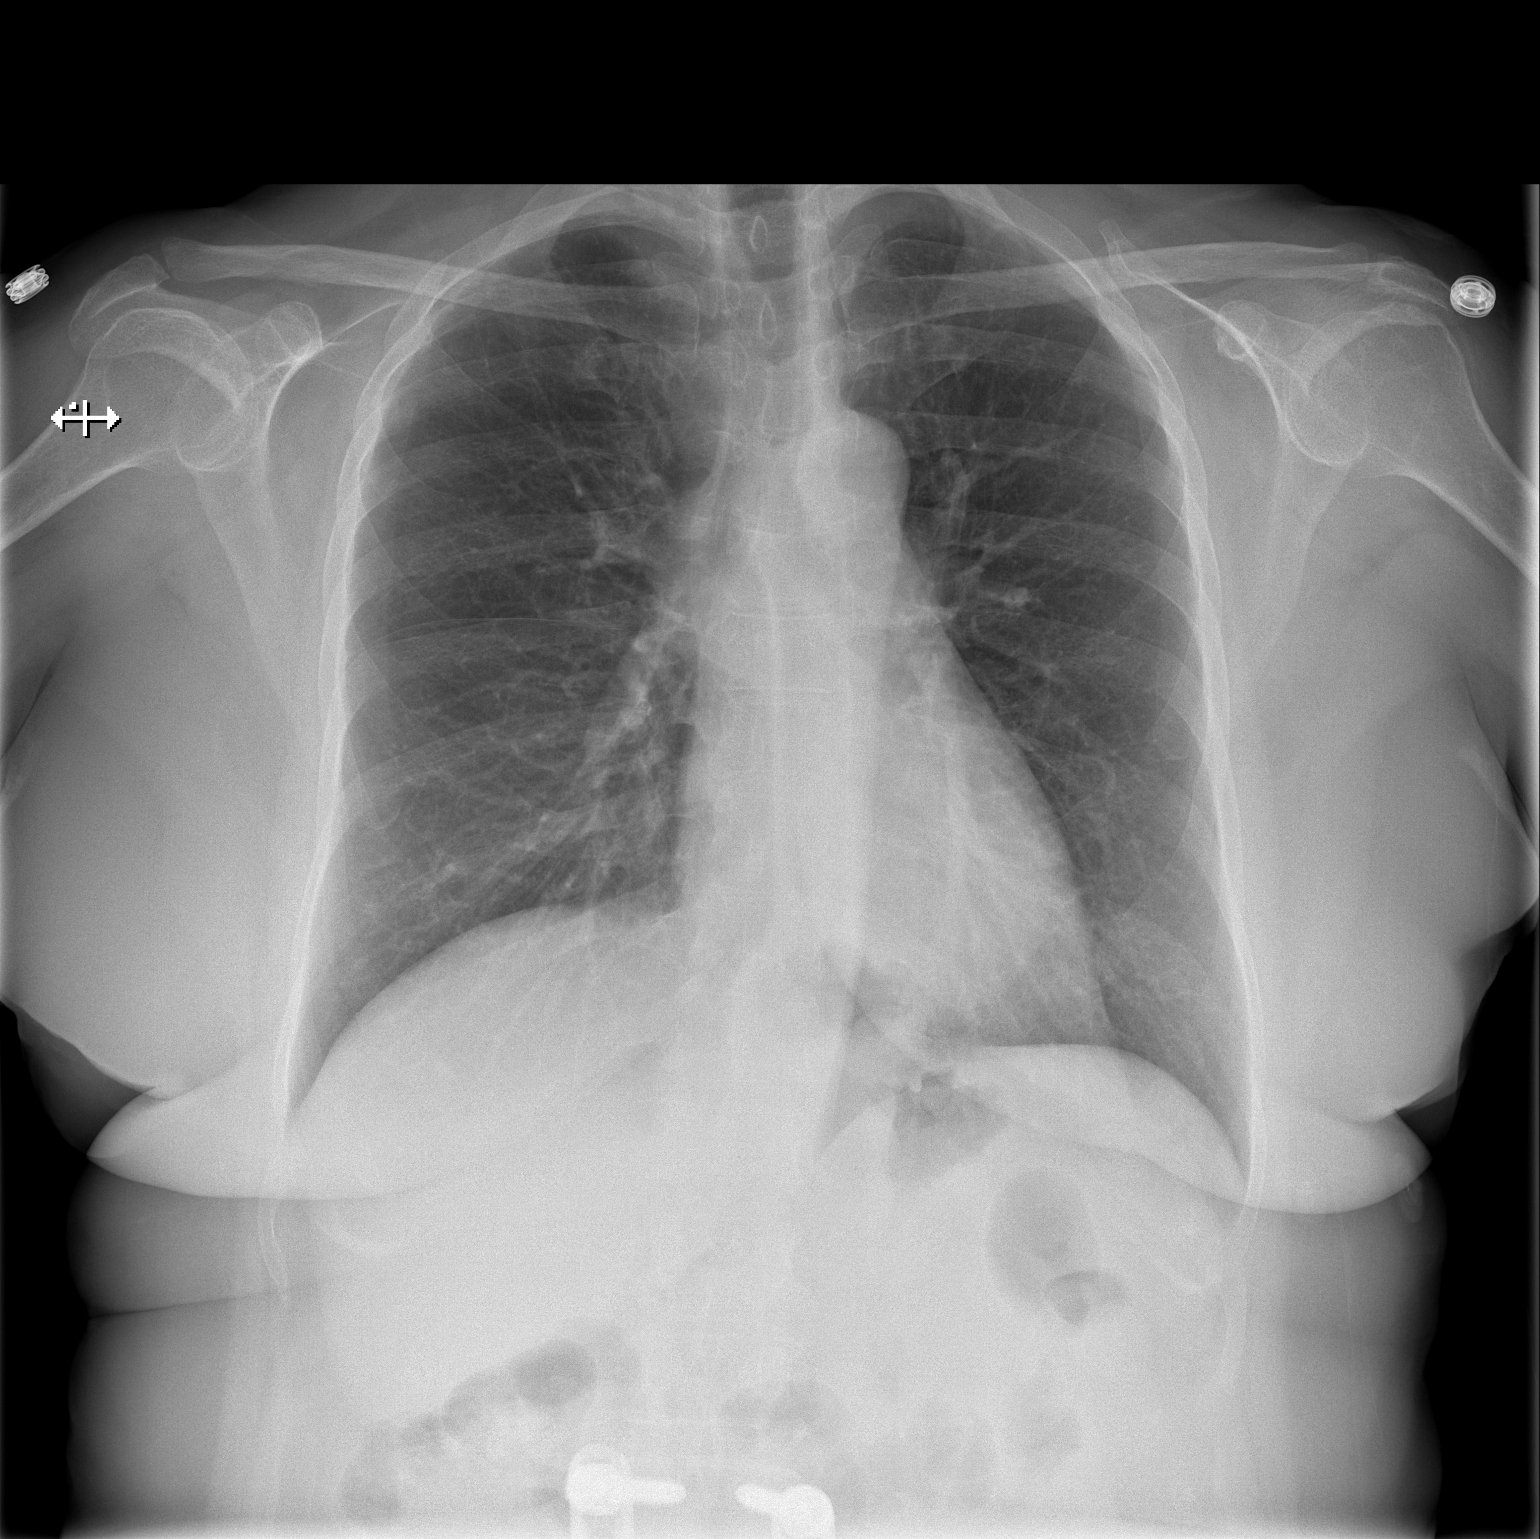

[w chest lat]
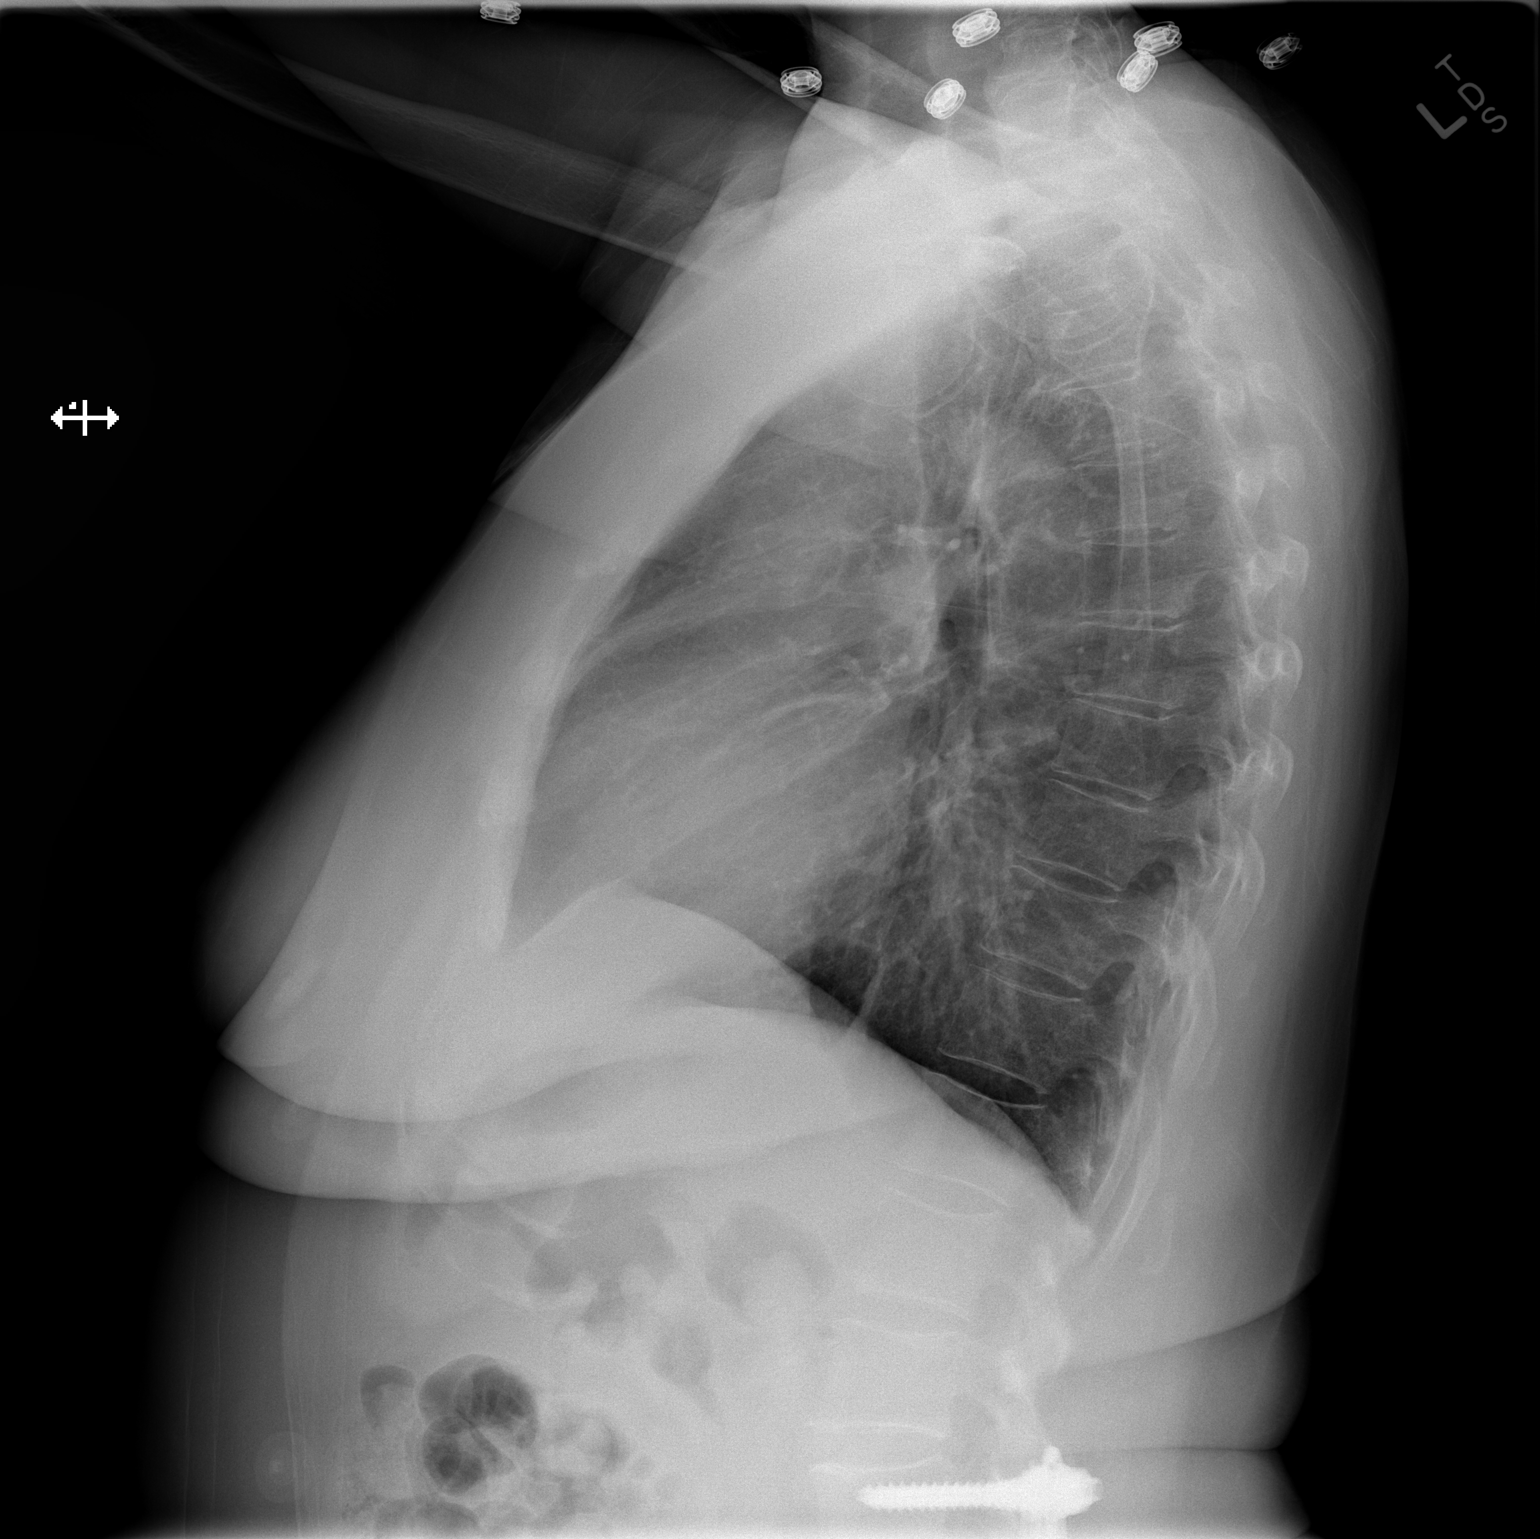

[2 of 2 positions shown; findings below may reference images not displayed]

FINDINGS: Lumbar fusion hardware partly visualized.  Heart size is
normal.  The lungs are clear.  No pleural effusion.  Leftward
curvature of the thoracic spine again noted.
IMPRESSION: No acute cardiopulmonary process.

## 2014-02-16 IMAGING — CR DG ABDOMEN 1V
1 series · 1 of 1 positions shown · non-contrast
Comparison: Lumbar radiographs dated 04/20/2011

CLINICAL DATA: Epigastric pain.

ABDOMEN - 1 VIEW

[t abdomen supine]
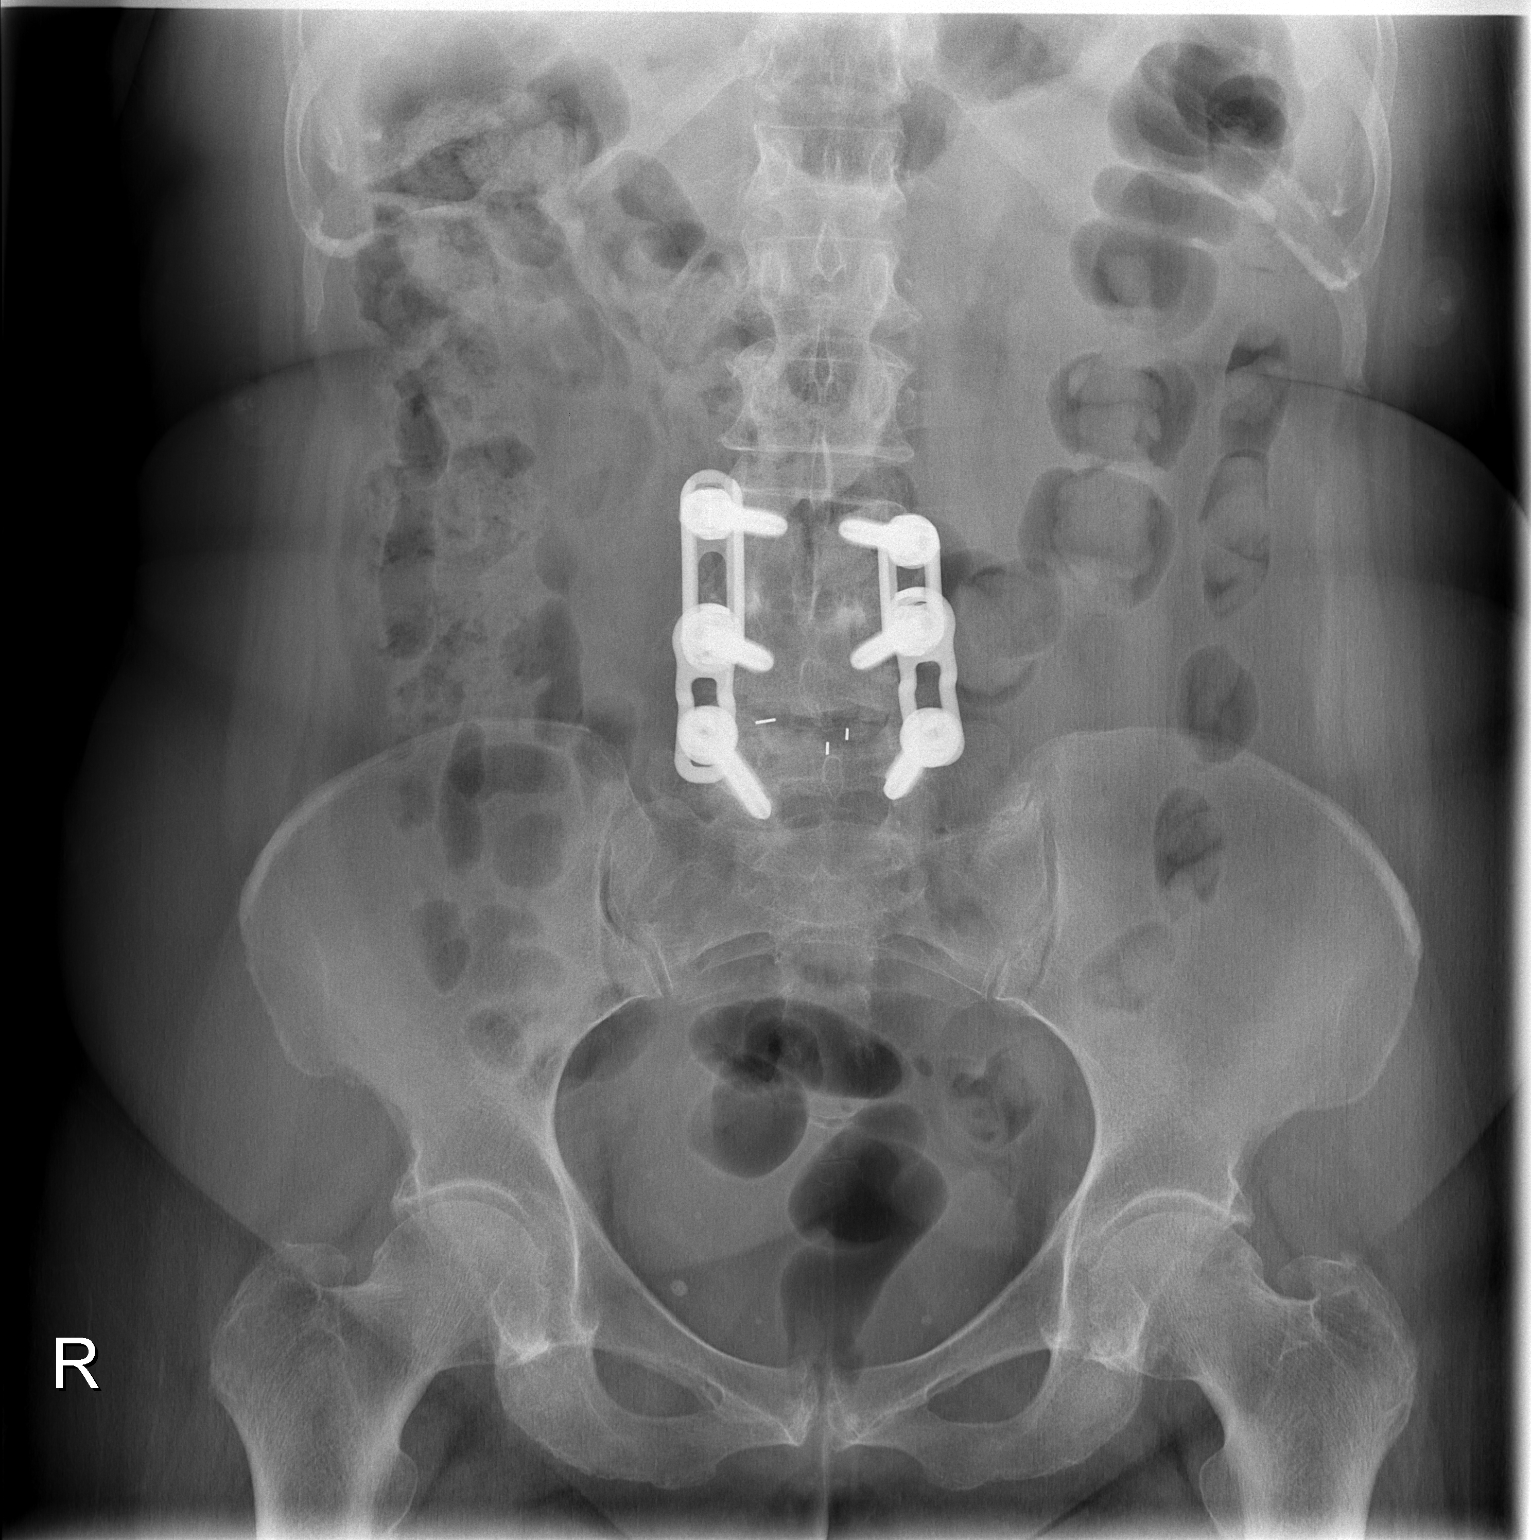

[1 of 1 positions shown; findings below may reference images not displayed]

FINDINGS: The bowel gas pattern is normal with air scattered
throughout the nondistended colon.  No dilated small bowel.
Stomach is not distended.

No acute osseous abnormality.  No visible free air free fluid.
Previous lower lumbar fusion.
IMPRESSION: No acute abnormality.

## 2014-05-10 ENCOUNTER — Other Ambulatory Visit: Payer: Self-pay | Admitting: Critical Care Medicine

## 2014-07-24 ENCOUNTER — Encounter: Payer: Self-pay | Admitting: Internal Medicine

## 2014-08-01 ENCOUNTER — Encounter (HOSPITAL_COMMUNITY): Payer: Self-pay | Admitting: Emergency Medicine

## 2014-08-01 ENCOUNTER — Emergency Department (HOSPITAL_COMMUNITY)
Admission: EM | Admit: 2014-08-01 | Discharge: 2014-08-01 | Disposition: A | Discharge status: Home / Self Care | Payer: Medicare Other | Attending: Emergency Medicine | Admitting: Emergency Medicine

## 2014-08-01 DIAGNOSIS — Z8719 Personal history of other diseases of the digestive system: Secondary | ICD-10-CM | POA: Insufficient documentation

## 2014-08-01 DIAGNOSIS — Z9851 Tubal ligation status: Secondary | ICD-10-CM | POA: Insufficient documentation

## 2014-08-01 DIAGNOSIS — Z90710 Acquired absence of both cervix and uterus: Secondary | ICD-10-CM | POA: Diagnosis not present

## 2014-08-01 DIAGNOSIS — Z8709 Personal history of other diseases of the respiratory system: Secondary | ICD-10-CM | POA: Insufficient documentation

## 2014-08-01 DIAGNOSIS — M545 Low back pain, unspecified: Secondary | ICD-10-CM

## 2014-08-01 DIAGNOSIS — I10 Essential (primary) hypertension: Secondary | ICD-10-CM | POA: Diagnosis not present

## 2014-08-01 DIAGNOSIS — Z9889 Other specified postprocedural states: Secondary | ICD-10-CM | POA: Diagnosis not present

## 2014-08-01 DIAGNOSIS — Z7951 Long term (current) use of inhaled steroids: Secondary | ICD-10-CM | POA: Diagnosis not present

## 2014-08-01 DIAGNOSIS — Z8639 Personal history of other endocrine, nutritional and metabolic disease: Secondary | ICD-10-CM | POA: Diagnosis not present

## 2014-08-01 DIAGNOSIS — R109 Unspecified abdominal pain: Secondary | ICD-10-CM | POA: Insufficient documentation

## 2014-08-01 DIAGNOSIS — F329 Major depressive disorder, single episode, unspecified: Secondary | ICD-10-CM | POA: Insufficient documentation

## 2014-08-01 DIAGNOSIS — Z79899 Other long term (current) drug therapy: Secondary | ICD-10-CM | POA: Insufficient documentation

## 2014-08-01 LAB — CBC WITH DIFFERENTIAL/PLATELET
BASOS ABS: 0 10*3/uL (ref 0.0–0.1)
Basophils Relative: 0 % (ref 0–1)
Eosinophils Absolute: 0.2 10*3/uL (ref 0.0–0.7)
Eosinophils Relative: 2 % (ref 0–5)
HCT: 36 % (ref 36.0–46.0)
Hemoglobin: 11.8 g/dL — ABNORMAL LOW (ref 12.0–15.0)
LYMPHS PCT: 24 % (ref 12–46)
Lymphs Abs: 1.8 10*3/uL (ref 0.7–4.0)
MCH: 30.7 pg (ref 26.0–34.0)
MCHC: 32.8 g/dL (ref 30.0–36.0)
MCV: 93.8 fL (ref 78.0–100.0)
Monocytes Absolute: 0.6 10*3/uL (ref 0.1–1.0)
Monocytes Relative: 7 % (ref 3–12)
Neutro Abs: 5.2 10*3/uL (ref 1.7–7.7)
Neutrophils Relative %: 67 % (ref 43–77)
PLATELETS: 255 10*3/uL (ref 150–400)
RBC: 3.84 MIL/uL — ABNORMAL LOW (ref 3.87–5.11)
RDW: 13.7 % (ref 11.5–15.5)
WBC: 7.7 10*3/uL (ref 4.0–10.5)

## 2014-08-01 LAB — URINALYSIS, ROUTINE W REFLEX MICROSCOPIC
Glucose, UA: NEGATIVE mg/dL
Hgb urine dipstick: NEGATIVE
Ketones, ur: NEGATIVE mg/dL
Leukocytes, UA: NEGATIVE
NITRITE: NEGATIVE
Protein, ur: NEGATIVE mg/dL
Specific Gravity, Urine: 1.02 (ref 1.005–1.030)
UROBILINOGEN UA: 0.2 mg/dL (ref 0.0–1.0)
pH: 5.5 (ref 5.0–8.0)

## 2014-08-01 LAB — BASIC METABOLIC PANEL
ANION GAP: 12 (ref 5–15)
BUN: 13 mg/dL (ref 6–23)
CALCIUM: 9.7 mg/dL (ref 8.4–10.5)
CHLORIDE: 102 meq/L (ref 96–112)
CO2: 23 mEq/L (ref 19–32)
Creatinine, Ser: 1.27 mg/dL — ABNORMAL HIGH (ref 0.50–1.10)
GFR calc Af Amer: 51 mL/min — ABNORMAL LOW (ref >90)
GFR calc non Af Amer: 44 mL/min — ABNORMAL LOW (ref >90)
Glucose, Bld: 106 mg/dL — ABNORMAL HIGH (ref 70–99)
Potassium: 4.3 mEq/L (ref 3.7–5.3)
SODIUM: 137 meq/L (ref 137–147)

## 2014-08-01 MED ORDER — HYDROMORPHONE HCL 1 MG/ML IJ SOLN
1.0000 mg | Freq: Once | INTRAMUSCULAR | Status: AC
  Administered 2014-08-01: 1 mg via INTRAVENOUS
  Filled 2014-08-01: qty 1

## 2014-08-01 MED ORDER — ONDANSETRON HCL 4 MG/2ML IJ SOLN
4.0000 mg | Freq: Once | INTRAMUSCULAR | Status: AC
  Administered 2014-08-01: 4 mg via INTRAVENOUS
  Filled 2014-08-01: qty 2

## 2014-08-01 MED ORDER — METHOCARBAMOL 500 MG PO TABS: 500.0000 mg | ORAL_TABLET | Freq: Two times a day (BID) | ORAL | Status: AC

## 2014-08-01 MED ORDER — DIAZEPAM 5 MG/ML IJ SOLN
5.0000 mg | Freq: Once | INTRAMUSCULAR | Status: AC
  Administered 2014-08-01: 5 mg via INTRAVENOUS
  Filled 2014-08-01: qty 2

## 2014-08-01 MED ORDER — OXYCODONE-ACETAMINOPHEN 5-325 MG PO TABS: 1.0000 | ORAL_TABLET | Freq: Four times a day (QID) | ORAL | Status: AC | PRN

## 2014-08-01 NOTE — ED Notes (Signed)
Patient advises that she woke with pain in the am last Sunday. Pain is a 9/10 on the pain scale. Denies injury, pain radiates around to right and left lower abdomen. History of 4 back surgeries. Denies any issues with voiding, Last BM this morning dark green.

## 2014-08-01 NOTE — ED Notes (Signed)
(  CIWA 1025) and (PSY 1024) assessment charted on wrong patient.

## 2014-08-01 NOTE — ED Provider Notes (Signed)
examination/treatment/procedure(s) were conducted as a shared visit with non-physician practitioner(s) and myself.  I personally evaluated the patient during the encounter.   Pt presented to the ED with back pain.  It was radiating to the lower abdomen.  No syncope.  No  Numbness or weakness Physical Exam  BP 168/69  Pulse 71  Temp(Src) 98.1 F (36.7 C) (Oral)  Resp 8  SpO2 100%  Physical Exam  Nursing note and vitals reviewed. Constitutional: She appears well-developed and well-nourished. No distress.  HENT:  Head: Normocephalic and atraumatic.  Right Ear: External ear normal.  Left Ear: External ear normal.  Eyes: Conjunctivae are normal. Right eye exhibits no discharge. Left eye exhibits no discharge. No scleral icterus.  Neck: Neck supple. No tracheal deviation present.  Cardiovascular: Normal rate.   Pulmonary/Chest: Effort normal. No stridor. No respiratory distress.  Abdominal: Soft. Bowel sounds are normal. She exhibits no distension, no pulsatile midline mass and no mass. There is no tenderness. There is no rebound and no guarding.  Musculoskeletal: She exhibits no edema.       Lumbar back: She exhibits decreased range of motion and tenderness.  Neurological: She is alert. Cranial nerve deficit: no gross deficits.  Skin: Skin is warm and dry. No rash noted.  Psychiatric: She has a normal mood and affect.    ED Course  Procedures  MDM Consistent with musculoskeletal pain.  Doubt kidney stone.  No abdominal ttp.  Doubt AAA or other acute abdominal emergency      Linwood DibblesJon Prophet Renwick, MD 08/01/14 1136

## 2014-08-01 NOTE — ED Provider Notes (Signed)
CSN: [id number]     Arrival date & time 08/01/14  0847 History   First MD Initiated Contact with Patient 08/01/14 [phone]     Chief Complaint  Patient presents with  . Back Pain  . Abdominal Pain   (Consider location/radiation/quality/duration/timing/severity/associated sxs/prior Treatment) HPI Teresa Benson is a 63 yo female presenting with back pain and abd pain x 1 week.  She reprots six days ago she began having constant low back pain when she woke up.  She describes it as a throbbing pain and rated it as a 5-6/10.  The following day she began having lower abd pain that she describes as constant throbbing ache.  She notes lying on her side helped some but that no longer helps.  She states the pain is worsened with moving and breathing.  She reports 4 back surgeries in the past for ruptured discs and spinal fusions. She denies any fevers, chills, nausea, vomiting, or dysuria.    Past Medical History  Diagnosis Date  . Lower back pain   . HTN (hypertension)   . Dyslipidemia   . Mild aortic sclerosis   . Tachycardia   . Normal nuclear stress test July 2010  . H/O: GI bleed   . Depression   . Chronic sinusitis    Past Surgical History  Procedure Laterality Date  . Tubal ligation    . Abdominal hysterectomy    . Back surgery     Family History  Problem Relation Age of Onset  . Stroke Father   . Heart disease Mother     CABG  . Heart attack Mother   . Asthma Grandchild    History  Substance Use Topics  . Smoking status: Never Smoker   . Smokeless tobacco: Never Used     Comment: Lived with a smoker for at least 20 years  . Alcohol Use: No   OB History   Grav Para Term Preterm Abortions TAB SAB Ect Mult Living                 Review of Systems  Constitutional: Negative for fever and chills.  HENT: Negative for sore throat.   Eyes: Negative for visual disturbance.  Respiratory: Negative for cough and shortness of breath.   Cardiovascular: Negative for chest pain and  leg swelling.  Gastrointestinal: Positive for abdominal pain. Negative for nausea, vomiting and diarrhea.  Genitourinary: Negative for dysuria.  Musculoskeletal: Positive for back pain. Negative for myalgias.  Skin: Negative for rash.  Neurological: Negative for weakness, numbness and headaches.    Allergies  Review of patient's allergies indicates no known allergies.  Home Medications   Prior to Admission medications   Medication Sig Start Date End Date Taking? Authorizing Provider  albuterol (PROVENTIL) (2.5 MG/3ML) 0.083% nebulizer solution USE 1 VIAL VIA NEBULIZER EVERY 6 HOURS AS NEEDED FOR WHEEZING 09/24/13   Nyoka CowdenMichael B Wert, MD  albuterol (VENTOLIN HFA) 108 (90 BASE) MCG/ACT inhaler Inhale 2 puffs into the lungs every 4 (four) hours as needed for wheezing or shortness of breath.    Historical Provider, MD  bisoprolol (ZEBETA) 10 MG tablet TAKE 1 TABLET DAILY 01/16/14   Nyoka CowdenMichael B Wert, MD  budesonide-formoterol [facility](SYMBICORT) 80-4.5 MCG/ACT inhaler Inhale 2 puffs into the lungs 2 (two) times daily. 06/11/13   Nyoka CowdenMichael B Wert, MD  chlorpheniramine (CHLOR-TRIMETON) 4 MG tablet One every night at bedtime and as needed during the day only if you notice throat tickling/ drainage 06/11/13   Nyoka CowdenMichael B Wert, MD  dextromethorphan (DELSYM) 30 MG/5ML liquid 2 tsp every 12 hours as needed - w/ flutter valve    Historical Provider, MD  famotidine (PEPCID) 20 MG tablet Take 20 mg by mouth at bedtime.  08/14/12   Nyoka Cowden, MD  fluticasone (FLONASE) 50 MCG/ACT nasal spray PLACE 2 SPRAYS INTO THE NOSE 2 (TWO) TIMES DAILY. 12/15/13   Storm Frisk, MD  furosemide (LASIX) 20 MG tablet TAKE 1 TABLET BY MOUTH DAILY    Tammy S Parrett, NP  Multiple Vitamin (MULTIVITAMIN) tablet Take 1 tablet by mouth every morning.    Historical Provider, MD  pantoprazole (PROTONIX) 40 MG tablet Take 1 tablet (40 mg total) by mouth daily. Take 30-60 min before first meal of the day 09/24/13   Nyoka Cowden, MD  predniSONE  (STERAPRED UNI-PAK) 10 MG tablet Prednisone 10 mg take  4 each am x 2 days,   2 each am x 2 days,  1 each am x2days and stop 09/24/13   Nyoka Cowden, MD  SYMBICORT 160-4.5 MCG/ACT inhaler INHALE 2 PUFFS INTO THE LUNGS 2 (TWO) TIMES DAILY.    Nyoka Cowden, MD  traMADol Janean Sark) 50 MG tablet 1-2 every 4 hours as needed for cough or pain 09/24/13   Nyoka Cowden, MD  zolpidem (AMBIEN CR) 6.25 MG CR tablet TAKE 1 TABLET BY MOUTH AT BEDTIME AS NEEDED FOR SLEEP 04/09/13   [name], MD   BP 168/69  Pulse 71  Temp(Src) 98.1 F (36.7 C) (Oral)  Resp 8  SpO2 100% Physical Exam  Nursing note and vitals reviewed. Constitutional: She is oriented to person, place, and time. She appears well-developed and well-nourished. No distress.  HENT:  Head: Normocephalic and atraumatic.  Mouth/Throat: Oropharynx is clear and moist. No oropharyngeal exudate.  Eyes: Conjunctivae are normal.  Neck: Neck supple. No thyromegaly present.  Cardiovascular: Normal rate, regular rhythm and intact distal pulses.   Pulmonary/Chest: Effort normal and breath sounds normal. No respiratory distress. She has no wheezes. She has no rales. She exhibits no tenderness.  Abdominal: Soft. There is no tenderness.  Musculoskeletal:       Lumbar back: She exhibits tenderness.       Back:  She is exquisitely tender over her lumber spine  Lymphadenopathy:    She has no cervical adenopathy.  Neurological: She is alert and oriented to person, place, and time. She has normal strength. No cranial nerve deficit or sensory deficit. GCS eye subscore is 4. GCS verbal subscore is 5. GCS motor subscore is 6.  Pt reports pain with ROM but has 5/5 strength in lower extremities  Skin: Skin is warm and dry. No rash noted. She is not diaphoretic.  Psychiatric: She has a normal mood and affect.    ED Course  Procedures (including critical care time) Labs Review Labs Reviewed  URINALYSIS, ROUTINE W REFLEX MICROSCOPIC - Abnormal;  Notable for the following:    Color, Urine AMBER (*)    APPearance CLOUDY (*)    Bilirubin Urine SMALL (*)    All other components within normal limits  CBC WITH DIFFERENTIAL - Abnormal; Notable for the following:    RBC 3.84 (*)    Hemoglobin 11.8 (*)    All other components within normal limits  BASIC METABOLIC PANEL - Abnormal; Notable for the following:    Glucose, Bld 106 (*)    Creatinine, Ser 1.27 (*)    GFR calc non Af Amer 44 (*)    GFR calc  Af Amer 51 (*)    All other components within normal limits    Imaging Review No results found.   EKG Interpretation None      MDM   Final diagnoses:  Midline low back pain without sciatica   63 yo female with back pain and reports of abd pain.  Labs and exam are reassuring there is not abdominal etiology for this pain as her pain is recreated with palpation of her lumbar spine.  She denies new injury, bowel or bladder retention or incontinence.  Pt ambulated without difficulty in the ED. Case discussed with Dr. Lynelle DoctorKnapp and he is in agreement with treatment of recurrent back pain.  Pain managed in the ED.  Discharge instructions include prescription for pain meds, and follow-up with pt's neurosurgeon.  Pt aware of plan and in agreement.  Return precautions provided.    Filed Vitals:   08/01/14 1137 08/01/14 1145 08/01/14 1200 08/01/14 1228  BP: 113/51  116/57 140/81  Pulse: 58 58 60 59  Temp:      TempSrc:      Resp: 13 11 16 13   SpO2: 99% 95% 93% 97%   Meds given in ED:  Medications  ondansetron (ZOFRAN) injection 4 mg (4 mg Intravenous Given 08/01/14 1001)  HYDROmorphone (DILAUDID) injection 1 mg (1 mg Intravenous Given 08/01/14 1004)  diazepam (VALIUM) injection 5 mg (5 mg Intravenous Given 08/01/14 1141)    Discharge Medication List as of 08/01/2014 12:02 PM    START taking these medications   Details  methocarbamol (ROBAXIN) 500 MG tablet Take 1 tablet (500 mg total) by mouth 2 (two) times daily., Starting  08/01/2014, Until Discontinued, Print    oxyCODONE-acetaminophen (PERCOCET/ROXICET) 5-325 MG per tablet Take 1-2 tablets by mouth every 6 (six) hours as needed for moderate pain or severe pain., Starting 08/01/2014, Until Discontinued, Print          Harle BattiestElizabeth Kimeka Badour, NP 08/04/14 1556

## 2014-08-01 NOTE — Discharge Instructions (Signed)
Please follow the directions provided.  Be sure to call Monday morning to arrange an appointment with your neurosurgeon to follow-up with this back pain.  You may use the prescriptions medicines as prescribed for pain.  Do not hesitate to return for new, worsening or concerning symptoms.  SEEK IMMEDIATE MEDICAL CARE IF:  You have pain that radiates from your back into your legs.  You develop new bowel or bladder control problems.  You have unusual weakness or numbness in your arms or legs.  You develop nausea or vomiting.  You develop abdominal pain.  You feel faint.

## 2014-08-01 NOTE — ED Notes (Signed)
Patient discharged with her sister driving pain has improved. Instructions give using the teach back method. NAD noted

## 2014-08-25 ENCOUNTER — Other Ambulatory Visit: Payer: Self-pay | Admitting: Internal Medicine

## 2014-12-05 ENCOUNTER — Other Ambulatory Visit: Payer: Self-pay | Admitting: Internal Medicine
